# Patient Record
Sex: Male | Born: 1975
Health system: Southern US, Community
[De-identification: ages and names within clinical notes are randomized; demographics above are authoritative.]

## PROBLEM LIST (undated history)

## (undated) DIAGNOSIS — I10 Essential (primary) hypertension: Secondary | ICD-10-CM

## (undated) DIAGNOSIS — D485 Neoplasm of uncertain behavior of skin: Secondary | ICD-10-CM

## (undated) HISTORY — DX: Neoplasm of uncertain behavior of skin: D48.5

## (undated) HISTORY — PX: NO PAST SURGERIES: SHX2092

## (undated) HISTORY — DX: Essential (primary) hypertension: I10

---

## 2010-01-15 ENCOUNTER — Ambulatory Visit: Payer: Self-pay | Admitting: Family Medicine

## 2010-01-15 DIAGNOSIS — I10 Essential (primary) hypertension: Secondary | ICD-10-CM | POA: Insufficient documentation

## 2010-01-17 LAB — CONVERTED CEMR LAB
Albumin: 4.6 g/dL (ref 3.5–5.2)
Alkaline Phosphatase: 53 units/L (ref 39–117)
Basophils Relative: 3.1 % — ABNORMAL HIGH (ref 0.0–3.0)
CO2: 30 meq/L (ref 19–32)
Chloride: 108 meq/L (ref 96–112)
Eosinophils Absolute: 0.1 10*3/uL (ref 0.0–0.7)
Glucose, Bld: 79 mg/dL (ref 70–99)
HCT: 45 % (ref 39.0–52.0)
Hemoglobin: 15.1 g/dL (ref 13.0–17.0)
Lymphs Abs: 3.2 10*3/uL (ref 0.7–4.0)
MCHC: 33.6 g/dL (ref 30.0–36.0)
MCV: 88.8 fL (ref 78.0–100.0)
Monocytes Absolute: 0.4 10*3/uL (ref 0.1–1.0)
Neutro Abs: 5.5 10*3/uL (ref 1.4–7.7)
RBC: 5.07 M/uL (ref 4.22–5.81)
RDW: 12.2 % (ref 11.5–14.6)
Sodium: 144 meq/L (ref 135–145)
TSH: 2.29 microintl units/mL (ref 0.35–5.50)
Total CHOL/HDL Ratio: 4
Total Protein: 7.4 g/dL (ref 6.0–8.3)

## 2010-01-31 ENCOUNTER — Ambulatory Visit: Payer: Self-pay | Admitting: Family Medicine

## 2010-01-31 DIAGNOSIS — D485 Neoplasm of uncertain behavior of skin: Secondary | ICD-10-CM | POA: Insufficient documentation

## 2010-03-20 ENCOUNTER — Encounter: Payer: Self-pay | Admitting: Family Medicine

## 2010-04-02 DIAGNOSIS — D239 Other benign neoplasm of skin, unspecified: Secondary | ICD-10-CM

## 2010-04-02 HISTORY — DX: Other benign neoplasm of skin, unspecified: D23.9

## 2010-06-16 ENCOUNTER — Telehealth: Payer: Self-pay | Admitting: Family Medicine

## 2010-08-04 ENCOUNTER — Ambulatory Visit: Payer: Self-pay | Admitting: Family Medicine

## 2010-08-04 DIAGNOSIS — B359 Dermatophytosis, unspecified: Secondary | ICD-10-CM | POA: Insufficient documentation

## 2010-09-22 ENCOUNTER — Ambulatory Visit: Payer: Self-pay | Admitting: Family Medicine

## 2010-09-22 DIAGNOSIS — T148XXA Other injury of unspecified body region, initial encounter: Secondary | ICD-10-CM | POA: Insufficient documentation

## 2010-11-17 ENCOUNTER — Ambulatory Visit
Admission: RE | Admit: 2010-11-17 | Discharge: 2010-11-17 | Payer: Self-pay | Source: Home / Self Care | Attending: Family Medicine | Admitting: Family Medicine

## 2010-11-17 ENCOUNTER — Encounter: Payer: Self-pay | Admitting: Family Medicine

## 2010-11-18 ENCOUNTER — Telehealth: Payer: Self-pay | Admitting: Family Medicine

## 2010-12-09 NOTE — Consult Note (Signed)
Summary: [facility]  [facility]   Imported By: Lanelle Bal 04/01/2010 09:47:35  _____________________________________________________________________  External Attachment:    Type:   Image     Comment:   External Document

## 2010-12-09 NOTE — Progress Notes (Signed)
Summary: lotrel is causing a cough  Phone Note Call from Patient Call back at Work Phone [phone]   Caller: Patient Call For: Judith Part MD Summary of Call: Pt states his lotrel is making him cough.  This problem just recently started.  He is asking if he should change to something else.  Uses [facility] [address] Initial call taken by: Lowella Petties CMA,  June 16, 2010 1:01 PM  Follow-up for Phone Call        since lotrel is a drug with 2 meds -- we need to change the ace part of it and break up into 2 meds  will change to amlodipine and cozaar let me know if any problems or if cough does not stop  f/u 1 mo nurse visit for bp check px written on EMR for call in  Follow-up by: Judith Part MD,  June 16, 2010 1:40 PM  Additional Follow-up for Phone Call Additional follow up Details #1::        Patient notified as instructed by telephone. Pt said he has an appt to see Dr Milinda Antis on 08-04-10 and will see her then rather than making a nurse visit in 4 weeks. Medication phoned to [facility] pharmacy as instructed. Lewanda Rife LPN  June 16, 2010 2:35 PM    New Allergies: ! * LOTREL New/Updated Medications: AMLODIPINE BESYLATE 5 MG TABS (AMLODIPINE BESYLATE) 1 by mouth once daily COZAAR 50 MG TABS (LOSARTAN POTASSIUM) 1 by mouth once daily New Allergies: ! * LOTRELPrescriptions: COZAAR 50 MG TABS (LOSARTAN POTASSIUM) 1 by mouth once daily  #30 x 11   Entered and Authorized by:   Judith Part MD   Signed by:   Lewanda Rife LPN on 16/08/9603   Method used:   Telephoned to ...         RxID:   [id number] AMLODIPINE BESYLATE 5 MG TABS (AMLODIPINE BESYLATE) 1 by mouth once daily  #30 x 11   Entered and Authorized by:   Judith Part MD   Signed by:   Lewanda Rife LPN on 21/30/8657   Method used:   Telephoned to ...         RxID:   [id number]

## 2010-12-09 NOTE — Letter (Signed)
Summary: Out of Work  [facility] [employer] [facility] at [facility]  [address] [place], [place] [address]   Phone: [phone]  Fax: [phone]    September 22, 2010   Employee:  Raymond West    To Whom It May Concern:   For Medical reasons, please excuse the above named employee from work until back pain improves.   If you need additional information, please feel free to contact our office.         Sincerely,    Crawford Givens MD

## 2010-12-09 NOTE — Assessment & Plan Note (Signed)
Summary: 6 month follow up/rbh   Vital Signs:  Patient profile:   35 year old male Height:      70.25 inches Weight:      264 pounds BMI:     37.75 Temp:     97.7 degrees F oral Pulse rate:   84 / minute Pulse rhythm:   regular BP sitting:   128 / 82  (left arm) Cuff size:   large  Vitals Entered By: Lewanda Rife LPN (August 04, 2010 3:20 PM) CC: six month f/u   History of Present Illness: here for 6 mo f/u of HTN  was on lotrel but after developing cough was changed to separate amlodipine and cozaar  bp is good 128/82 today  feeling good  no more cough  no side effects at all   is staying away from salt  hard work -- active job   long time since Td - is always getting cut at work does not want a flu shot   has rash on R ankle -- tends to get ringworm   Allergies: 1)  ! Amoxicillin 2)  ! * Lotrel  Past History:  Past Medical History: Last updated: 01/15/2010 HTN  Past Surgical History: Last updated: 01/15/2010 Denies surgical history  Family History: Last updated: 01/15/2010 Father: Living ? kidney problems has been on dialysis, has diabetes ? 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 Eyes:  Denies blurring and eye irritation. CV:  Denies chest pain or discomfort, palpitations, shortness of breath with exertion, and swelling of feet. Resp:  Denies cough and shortness of breath. GI:  Denies nausea. GU:  Denies urinary frequency. MS:  Denies muscle aches. Derm:  Complains of itching and rash. Neuro:  Denies headaches, numbness, and sensation of room spinning. Heme:  Denies  abnormal bruising and bleeding.  Physical Exam  General:  overweight but generally well appearing  Head:  normocephalic, atraumatic, and no abnormalities observed.   Eyes:  vision grossly intact, pupils equal, pupils round, and pupils reactive to light.   Mouth:  pharynx pink and moist.   Neck:  supple with full rom and no masses or thyromegally, no JVD or carotid bruit  Lungs:  Normal respiratory effort, chest expands symmetrically. Lungs are clear to auscultation, no crackles or wheezes. Heart:  Normal rate and regular rhythm. S1 and S2 normal without gallop, murmur, click, rub or other extra sounds. Msk:  no acute joint changes  Extremities:  No clubbing, cyanosis, edema, or deformity noted with normal full range of motion of all joints.   Neurologic:  sensation intact to light touch and gait normal.   Skin:  L ankle 2-3 cm oval area of raised erythema with central clearing  some scale   Cervical Nodes:  No lymphadenopathy noted Psych:  normal affect, talkative and pleasant    Impression & Recommendations:  Problem # 1:  ESSENTIAL HYPERTENSION (ICD-401.9) Assessment Improved  improved and stable on current meds (off ace due to cough) disc lifestyle changes  stay active  f/u july for check up His updated medication list for this problem includes:    Amlodipine Besylate 5 Mg Tabs (Amlodipine besylate) .Marland Kitchen... 1 by mouth once daily  Cozaar 50 Mg Tabs (Losartan potassium) .Marland Kitchen... 1 by mouth once daily  BP today: 128/82 Prior BP: 130/88 (01/31/2010)  Labs Reviewed: K+: 4.3 (01/15/2010) Creat: : 0.9 (01/15/2010)   Chol: 163 (01/15/2010)   HDL: 39.40 (01/15/2010)   LDL: 92 (01/15/2010)   TG: 160.0 (01/15/2010)  Problem # 2:  RINGWORM (ICD-110.9) Assessment: New incidental finding of 2-3 cm rash L ankle with central clearing (pt states he gets ringworm all the time )  adv to use antifungal spray otc that usually works for him and change to new boots (wearing someone elses  boots) update if not resolved in 2 wk  Complete Medication List: 1)  Amlodipine Besylate 5 Mg Tabs (Amlodipine besylate) .Marland Kitchen.. 1 by mouth once daily 2)  Cozaar 50 Mg Tabs (Losartan potassium) .Marland Kitchen.. 1 by mouth once daily 3)  Multivitamins Tabs (Multiple vitamin) .... Take 1 tablet by mouth once a day  Other Orders: TD Toxoids IM 7 YR + ([address]) Admin 1st Vaccine (60[address])  Patient Instructions: 1)  tetnus shot today 2)  blood pressure in good control 3)  get good exercise 4)  switch your boots  5)  use anti fungal spray on rash- update me if that does not work  6)  follow up with me in july for a check up   Current Allergies (reviewed today): ! AMOXICILLIN ! * LOTREL   Immunizations Administered:  Tetanus Vaccine:    Vaccine Type: Td    Site: left deltoid    Mfr: Sanofi Pasteur    Dose: 0.5 ml    Route: IM    Given by: Lewanda Rife LPN    Exp. Date: 12/11/2011    Lot #: [id number]    VIS given: 09/26/08 version given August 04, 2010.

## 2010-12-09 NOTE — Assessment & Plan Note (Signed)
Summary: LOWER BACK PAIN/DLO   Vital Signs:  Patient profile:   35 year old male Height:      70.25 inches Weight:      264 pounds BMI:     37.75 Temp:     97.6 degrees F oral Pulse rate:   80 / minute Pulse rhythm:   regular BP sitting:   110 / 62  (left arm) Cuff size:   large  Vitals Entered By: Delilah Shan CMA Adilynne Fitzwater Dull) (September 22, 2010 11:09 AM) CC: LBP   History of Present Illness: Sat PM and Sunday all day.  Pain in lower back.  Couldn't go to work today.  Some help with laying on the floor, but tough to get up.  Lower back, just to R of midline.  No new triggers.  Played softball on Saturday.  No pain down leg.  More pain standing on R leg.  No FCNAV.  R handed. No h/o pop/snapping per patient.  Worse pain this AM, some better as the day goes on.  Better with movement, stiffer wtih prolonged sitting/laying down.   Allergies: 1)  ! Amoxicillin 2)  ! * Lotrel  Review of Systems       See HPI.  Otherwise negative.    Physical Exam  General:  NAD RRR CTAB no midline pain in back.  R of midline in lower L spine with pain reported, not worse with palpation.  SLR neg but hamstring tight and this stretches tender are in back.  no pain in int/ext rotation of hips.  distally nv intact.     Impression & Recommendations:  Problem # 1:  MUSCLE STRAIN (ICD-848.9) Likley benign muscle strain.  D/w patient ZO:XWRU and stretching.  Ibuprofen in meantime and out of for now.  Should improve gradually.  follow up as needed, no need to image.  He agrees/understands.   Complete Medication List: 1)  Amlodipine Besylate 5 Mg Tabs (Amlodipine besylate) .Marland Kitchen.. 1 by mouth once daily 2)  Cozaar 50 Mg Tabs (Losartan potassium) .Marland Kitchen.. 1 by mouth once daily 3)  Multivitamins Tabs (Multiple vitamin) .... Take 1 tablet by mouth once a day  Patient Instructions: 1)  I would use the knee to chest stretch (straight ahead and across your trunk) and try to stretch easily.  Use a heating pad and let  me know if you aren't getting better.   Take care.    Orders Added: 1)  Est. Patient Level III [04540]    Current Allergies (reviewed today): ! AMOXICILLIN ! * LOTREL

## 2010-12-09 NOTE — Assessment & Plan Note (Signed)
Summary: 2-4 WEEK FOLLOW UP/RBH   Vital Signs:  Patient profile:   35 year old male Height:      70.25 inches Weight:      258.25 pounds BMI:     36.92 Temp:     97.3 degrees F oral Pulse rate:   76 / minute Pulse rhythm:   regular BP sitting:   130 / 88  (left arm) Cuff size:   large  Vitals Entered By: Linde Gillis CMA Duncan Dull) (January 31, 2010 11:58 AM)  Serial Vital Signs/Assessments:  Time      Position  BP       Pulse  Resp  Temp     By                     045/40                         Judith Part MD  CC: 2-4 week follow up   History of Present Illness: last visit started on lotrel 5-10 for HTN  no side eff on med  feels fine  no swelling in legs  cannot tell he is on it   bp is improved today- first check 130/88  labs were ok  Last Lipid ProfileCholesterol: 163 (01/15/2010 4:24:37 PM)HDL:  39.40 (01/15/2010 4:24:37 PM)LDL:  92 (01/15/2010 4:24:37 PM)Triglycerides:  Last Liver profileSGOT:  36 (01/15/2010 4:24:37 PM)SPGT:  42 (01/15/2010 4:24:37 PM)T. Bili:  0.6 (01/15/2010 4:24:37 PM)Alk Phos:  53 (01/15/2010 4:24:37 PM)   stopped sodas completely lots of water avoiding salt and processed pork more grilled meats working physically a lot   has a mole on his back- is speckled       Allergies: 1)  ! Amoxicillin  Past History:  Past Medical History: Last updated: 01/15/2010 HTN  Past Surgical History: Last updated: 01/15/2010 Denies surgical history  Family History: Last updated: 01/15/2010 Father: Living ? kidney problems has been on dialysis, has diabetes ? 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 Eyes:  Denies blurring. CV:  Denies chest pain or discomfort, palpitations, shortness of breath with exertion, and swelling of feet. Resp:  Denies cough and shortness of breath. GI:  Denies abdominal pain, change in bowel habits, and nausea. MS:  Denies muscle aches. Derm:  Denies poor wound healing and rash. Neuro:  Denies headaches, numbness, and tingling. Endo:  Denies excessive thirst and excessive urination. Heme:  Denies abnormal bruising and bleeding.  Physical Exam  General:  overweight but generally well appearing  Head:  normocephalic, atraumatic, and no abnormalities observed.   Eyes:  vision grossly intact, pupils equal, pupils round, and pupils reactive to light.   Mouth:  pharynx pink and moist.   Neck:  supple with full rom and no masses or thyromegally, no JVD or carotid bruit  Lungs:  Normal respiratory effort, chest expands symmetrically. Lungs are clear to auscultation, no crackles or wheezes. Heart:  Normal rate and regular rhythm. S1 and S2 normal without gallop, murmur, click, rub or other extra sounds. Msk:  No deformity or scoliosis noted of thoracic or lumbar spine.   Extremities:  No clubbing, cyanosis,  edema, or deformity noted with normal full range of motion of all joints.   Skin:  3-4 mm irregular flat nevus mid back - light brown with specks of black color  flushed/ ruddy complexion Cervical Nodes:  No lymphadenopathy noted Psych:  normal affect, talkative and pleasant    Impression & Recommendations:  Problem # 1:  ESSENTIAL HYPERTENSION (ICD-401.9) Assessment Improved much imp with lotrel  will continue this and lifestyle changes f/u 6 mo  if up - will consider inc dose rev labs in detail today with fair chol prof (rev sat fats in diet)  His updated medication list for this problem includes:    Lotrel 5-10 Mg Caps (Amlodipine besy-benazepril hcl) .Marland Kitchen... 1 by mouth once daily in am  Problem # 2:  NEOPLASM, SKIN, UNCERTAIN  BEHAVIOR (ICD-238.2) Assessment: New nevus on back - irregular shape and color ref derm  pt wears clothing on back- no sunscreen - counseled on this  Orders: Dermatology Referral (Derma)  Complete Medication List: 1)  Lotrel 5-10 Mg Caps (Amlodipine besy-benazepril hcl) .Marland Kitchen.. 1 by mouth once daily in am  Patient Instructions: 1)  keep up the good work with better diet  2)  keep up exercise 3)  no change in medicine  4)  blood pressure better today 122/88  5)  follow up with me in about 6 months  6)  we will do derm ref at check out  Prescriptions: LOTREL 5-10 MG CAPS (AMLODIPINE BESY-BENAZEPRIL HCL) 1 by mouth once daily in am  #30 x 11   Entered and Authorized by:   Judith Part MD   Signed by:   Judith Part MD on 01/31/2010   Method used:   Electronically to        [facility]. [address] [phone]* (retail)       [address] [place], [place]  [id number]       Ph: [phone]       Fax: [phone]   RxID:   [id number]   Current Allergies (reviewed today): ! AMOXICILLIN

## 2010-12-09 NOTE — Assessment & Plan Note (Signed)
Summary: NEW PATIENT EST per Dr. Milinda Antis / LFW   Vital Signs:  Patient profile:   35 year old male Height:      70.25 inches Weight:      258.25 pounds BMI:     36.92 Temp:     97.7 degrees F oral Pulse rate:   76 / minute Pulse rhythm:   regular BP sitting:   160 / 98  (left arm) Cuff size:   large  Vitals Entered By: Lewanda Rife LPN (January 16, 1323 3:42 PM)   History of Present Illness: here to est as new pt and address high bp   thinks his bp has been up for a while   had an assessment at work -- with a physical course for that  was high 140/104 on first check -- nurse got alamed and would not let him do the test  diet is pretty good lots of grilled food and rice some sodas/ caffiene -- really likes soda --- then quit it (now just water)  that was 1 week ago  not a lot of processed foods or salt  some beef and some fried foods -- not a lot   in terms of exercise - very strenous job working in hot conditions -- with lots of heavy lifting    has lost 6 lb since then   is feeling good  no symptoms from HTN for the most part   went to Dr Mackey Birchwood -- and was put on benicar 20 mg free sample  never stated it  used to go to [employer]     Preventive Screening-Counseling & Management  Alcohol-Tobacco     Smoking Status: never  Allergies (verified): 1)  ! Amoxicillin  Past History:  Family History: Last updated: 01/15/2010 Father: Living ? kidney problems has been on dialysis, has diabetes ? 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 kidney problems  has been on dialysis, has diabetes ? 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 Eyes:  Denies blurring and eye irritation. CV:  Denies chest pain or discomfort, palpitations, and shortness of breath with exertion. Resp:  Denies cough and wheezing. GI:  Denies abdominal pain, bloody stools, change in bowel habits, indigestion, and nausea. GU:  Denies urinary frequency. MS:  Denies joint pain, joint redness, and joint swelling. Derm:  Denies itching, lesion(s), poor wound healing, and rash. Neuro:  Denies difficulty with concentration, disturbances in coordination, headaches, numbness, and tingling. Psych:  Denies anxiety and depression. Endo:  Denies cold intolerance, excessive thirst, excessive urination, and heat intolerance. Heme:  Denies abnormal bruising and bleeding.  Physical Exam  General:  overweight but generally well appearing  Head:  normocephalic, atraumatic, and no abnormalities observed.   Eyes:  vision grossly intact, pupils equal, pupils round, and pupils reactive to light.  grossly nl fundi no conjunctival pallor, injection or icterus  Ears:  R ear normal and L ear normal.   Nose:  no nasal discharge.   Mouth:  pharynx pink and moist.   Neck:  supple with full rom and no masses or thyromegally, no JVD or carotid bruit  Chest Wall:  No deformities, masses, tenderness or gynecomastia noted. Lungs:  Normal respiratory effort, chest expands symmetrically. Lungs are clear to auscultation, no crackles or wheezes. Heart:  Normal rate and regular rhythm. S1 and S2 normal without gallop, murmur, click, rub or other extra sounds. Abdomen:  Bowel sounds  positive,abdomen soft and non-tender without masses, organomegaly or hernias noted. no renal bruits  Msk:  No deformity or scoliosis noted of thoracic or lumbar spine.  no acute joint changes large / muscular build  Pulses:  R and L carotid,radial,femoral,dorsalis pedis and posterior tibial pulses are full and equal bilaterally Extremities:  No clubbing, cyanosis, edema, or deformity noted with normal full range of motion of all joints.   Neurologic:  cranial nerves II-XII intact, sensation intact to light touch, gait normal, and DTRs symmetrical and normal.  no tremor  Skin:  flushed cheeks and face- per pt baseline Cervical Nodes:  No lymphadenopathy noted Inguinal Nodes:  No significant adenopathy Psych:  normal affect, talkative and pleasant    Impression & Recommendations:  Problem # 1:  ESSENTIAL HYPERTENSION (ICD-401.9) Assessment New new and  moderate  possible fam hx  disc lifestyle change in detail - and handouts given from [facility]  start lotrel 5-10- update if any side eff  lab today  f/u in 2-4 weeks  His updated medication list for this problem includes:    Lotrel 5-10 Mg Caps (Amlodipine besy-benazepril hcl) .Marland Kitchen... 1 by mouth once daily in am  Orders: Venipuncture ([address]) TLB-Lipid Panel (80061-LIPID) TLB-BMP (Basic Metabolic Panel-BMET) (80048-METABOL) TLB-CBC Platelet - w/Differential (85025-CBCD) TLB-Hepatic/Liver Function Pnl (80076-HEPATIC) TLB-TSH (Thyroid Stimulating Hormone) (09811-BJY) Prescription Created Electronically [phone])  Complete Medication List: 1)  Lotrel 5-10 Mg Caps (Amlodipine besy-benazepril hcl) .Marland Kitchen.. 1 by mouth once daily in am  Patient Instructions: 1)  start lotrel - update me if any problems or side effects  2)  drink lots of water 3)  avoid salty foods  4)  stay away from soda 5)  stay active  6)  labs today 7)  follow up with me in 2-4 weeks  Prescriptions: LOTREL 5-10 MG CAPS (AMLODIPINE BESY-BENAZEPRIL HCL) 1 by mouth once daily  in am  #30 x 5   Entered and Authorized by:   Judith Part MD   Signed by:   Judith Part MD on 01/15/2010   Method used:   Electronically to        [facility]. [address] [phone]* (retail)       [address] [place], [place]  [address]       Ph: [phone]       Fax: [phone]   RxID:   [id number]   Prior Medications (reviewed today): None Current Allergies (reviewed today): ! AMOXICILLIN

## 2010-12-11 NOTE — Assessment & Plan Note (Signed)
Summary: ST,COUGH/CLE   Vital Signs:  Patient profile:   35 year old male Height:      70.25 inches Weight:      265.75 pounds BMI:     38.00 Temp:     97.6 degrees F oral Pulse rate:   84 / minute Pulse rhythm:   regular BP sitting:   130 / 80  (left arm) Cuff size:   large  Vitals Entered By: Delilah Shan CMA (AAMA) (November 17, 2010 2:30 PM) CC: ST, cough   History of Present Illness: Cough and ST for 1 week, facial congestion.  Wife was getting sick, currently on antibiotics.  No NAV but subjective fevers/chills last week.  Still with ST.  Occ sputum, yellow.  No tob.    Allergies: 1)  ! Amoxicillin 2)  ! * Lotrel  Review of Systems       See HPI.  Otherwise negative.    Physical Exam  General:  GEN: nad, alert and oriented HEENT: mucous membranes moist, TM w/o erythema, nasal epithelium injected, OP with cobblestoning and erythema NECK: supple w/o LA CV: rrr. PULM: ctab except for scattered ronchi, no inc wob ABD: soft, +bs EXT: no edema    Impression & Recommendations:  Problem # 1:  COUGH (ICD-786.2) supportive tx and start antibiotics for presumed bronchitis.  nontoxic and follow up as needed.  He agrees.  potentially contagious.  Return to work when cough improved.  Orders: Prescription Created Electronically [phone])  Complete Medication List: 1)  Amlodipine Besylate 5 Mg Tabs (Amlodipine besylate) .Marland Kitchen.. 1 by mouth once daily 2)  Cozaar 50 Mg Tabs (Losartan potassium) .Marland Kitchen.. 1 by mouth once daily 3)  Multivitamins Tabs (Multiple vitamin) .... Take 1 tablet by mouth once a day 4)  Zithromax 250 Mg Tabs (Azithromycin) .... 2 by mouth today and then 1 by mouth once daily for 4 days.  Patient Instructions: 1)  I would use the delsym for cough as needed and start the antibiotics today.  Let us know if you don't gradually improve.   Prescriptions: ZITHROMAX 250 MG TABS (AZITHROMYCIN) 2 by mouth today and then 1 by mouth once daily for 4 days.  #6 x 0  Entered and Authorized by:   Crawford Givens MD   Signed by:   Crawford Givens MD on 11/17/2010   Method used:   Electronically to        [facility]. [address] [phone]* (retail)       [address] [place], [place]  [id number]       Ph: [phone]       Fax: [phone]   RxID:   [id number]    Orders Added: 1)  Prescription Created Electronically [[id number]] 2)  Est. Patient Level III [34742]    Current Allergies (reviewed today): ! AMOXICILLIN ! * LOTREL

## 2010-12-11 NOTE — Progress Notes (Signed)
Summary: cough  Phone Note Call from Patient Call back at Home Phone [phone]   Caller: Patient Call For: Dr. Para March  Summary of Call: Patient was seen yesterday. He says that his cough has gotten worse, kept him up all night. He is asking if can get something for the cough called in to [facility] [address]  Initial call taken by: Melody Comas,  November 18, 2010 1:17 PM  Follow-up for Phone Call        please call in.  sedation caution.  follow up as needed.  Follow-up by: Crawford Givens MD,  November 18, 2010 1:32 PM  Additional Follow-up for Phone Call Additional follow up Details #1::        Patient Advised. Medication phoned to pharmacy.  Additional Follow-up by: Delilah Shan CMA (AAMA),  November 18, 2010 2:19 PM    New/Updated Medications: HYDROCODONE-HOMATROPINE 5-1.5 MG/5ML SYRP (HYDROCODONE-HOMATROPINE) 5ml by mouth q6h as needed for cough, sedation caution Prescriptions: HYDROCODONE-HOMATROPINE 5-1.5 MG/5ML SYRP (HYDROCODONE-HOMATROPINE) 5ml by mouth q6h as needed for cough, sedation caution  #6oz x 0   Entered and Authorized by:   Crawford Givens MD   Signed by:   Crawford Givens MD on 11/18/2010   Method used:   Telephoned to ...       [facility] [address] [phone]* (retail)       [address] [place], [place]  [id number]       Ph: [phone]       Fax: [phone]   RxID:   [id number]

## 2010-12-11 NOTE — Letter (Signed)
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         Sincerely,    Crawford Givens MD

## 2011-04-14 ENCOUNTER — Ambulatory Visit (INDEPENDENT_AMBULATORY_CARE_PROVIDER_SITE_OTHER): Payer: BC Managed Care – PPO | Admitting: Family Medicine

## 2011-04-14 ENCOUNTER — Encounter: Payer: Self-pay | Admitting: Family Medicine

## 2011-04-14 ENCOUNTER — Telehealth: Payer: Self-pay | Admitting: *Deleted

## 2011-04-14 VITALS — BP 118/70 | HR 92 | Temp 97.6°F | Ht 73.0 in | Wt 257.0 lb

## 2011-04-14 DIAGNOSIS — R079 Chest pain, unspecified: Secondary | ICD-10-CM

## 2011-04-14 DIAGNOSIS — S20219A Contusion of unspecified front wall of thorax, initial encounter: Secondary | ICD-10-CM

## 2011-04-14 DIAGNOSIS — R0781 Pleurodynia: Secondary | ICD-10-CM

## 2011-04-14 MED ORDER — IBUPROFEN 600 MG PO TABS: 600.0000 mg | ORAL_TABLET | Freq: Three times a day (TID) | ORAL | Status: AC | PRN

## 2011-04-14 MED ORDER — IBUPROFEN 600 MG PO TABS: 600.0000 mg | ORAL_TABLET | Freq: Three times a day (TID) | ORAL | Status: DC | PRN

## 2011-04-14 MED ORDER — TRAMADOL HCL 50 MG PO TABS: 50.0000 mg | ORAL_TABLET | Freq: Three times a day (TID) | ORAL | Status: AC | PRN

## 2011-04-14 NOTE — Telephone Encounter (Signed)
Rodney Booze just let me know he got in with Dr Reece Agar this afternoon-- thanks

## 2011-04-14 NOTE — Patient Instructions (Signed)
Rib bruise or fracture - use ibuprofen 600mg  2-3 times daily with food. May use tramadol for breakthrough pain. Ice to chest wall for inflammation. Update [place] if any questions or not improving as expected.  Bruised Ribs Bruised ribs can occur by a blow to the chest or by a fall against a hard object. Usually these will be much better in a couple weeks. If x-rays were taken today and no fractures (break in bone), the diagnosis (learning what is wrong) of bruising is made. However, broken ribs may not show up for several days, or may be discovered later on a routine x-ray when signs of healing show up. If this happens to you, it does not mean that something was missed on the x-ray, but simply that it did not show up on the first x-rays. Earlier diagnosis will not usually change the treatment.  HOME CARE INSTRUCTIONS  Avoid strenuous activity. Be careful during activities and avoid bumping the injured ribs. Activities that pull on the injured ribs and cause pain should be avoided, if possible.   For the first day or two, an ice pack used every 20 minutes while awake may be helpful. Put ice in a plastic bag and put a towel between the bag and the skin.   Eat a normal, well-balanced diet. Drink plenty of fluids to avoid constipation.   Take deep breaths several times a day to keep lungs free of infection. Try to cough several times a day. Splint the injured area with a pillow while coughing to ease pain. Coughing can help prevent pneumonia.   Wear a rib belt or binder ONLY IF told to do so by your caregiver. If you are wearing a rib belt or binder, you must do the breathing exercises as directed by your caregiver. If not used properly, rib belts and/or binders restrict breathing which can lead to pneumonia.   Only take over-the-counter or prescription medicines for pain, discomfort, or fever as directed by your caregiver.  SEEK MEDICAL CARE IF:  You or your child has an oral temperature above 101.     Your baby is older than 3 months with a rectal temperature of 100.5 F (38.1 C) or higher for more than 1 day.   You develop a cough, with thick or bloody sputum.  SEEK IMMEDIATE MEDICAL CARE IF:  You have difficulty breathing.   You feel sick to your stomach (nausea), have vomiting or belly (abdominal) pain.   You have worsening pain, not controlled with medications, or there is a change in the location of the pain.   You develop sweating or radiation of the pain into the arms, jaw or shoulders, or become light headed or faint.   You or your child has an oral temperature above 101, not controlled by medicine.   Your or your baby is older than 3 months with a rectal temperature of 102 F (38.9 C) or higher.   Your baby is 105 months old or younger with a rectal temperature of 100.4 F (38 C) or higher.  MAKE SURE YOU:  Understand these instructions.   Will watch your condition.   Will get help right away if you are not doing well or get worse.  Document Released: 07/21/2001 Document Re-Released: 11/17/2009 [facility] Patient Information 2011 [place], [place].

## 2011-04-14 NOTE — Progress Notes (Signed)
  Subjective:    Patient ID: Raymond West, male    DOB: 31-Oct-1976, 35 y.o.   MRN: [id number]  HPI CC: rib pain  DOI: 04/12/2011 Horse ran into him (back side).  Knocked him backwards.  Pain on side of left lower ribcage.  Pain with changing positions, cough, sneezing, laughing.  Sitting still fine.  hsan't tried anything for this.  (no NSAIDs, ice).  Pain not changing.  No SOB.  No bruising.  No cough.  No smokers at home.  No h/o lung injuries in past.  Works at [employer].  Some heavy lifting.  Review of Systems Per HPI    Objective:   Physical Exam  Nursing note and vitals reviewed. Constitutional: He appears well-developed and well-nourished. No distress.  HENT:  Head: Normocephalic and atraumatic.  Cardiovascular: Normal rate, regular rhythm, normal heart sounds and intact distal pulses.   No murmur heard. Pulmonary/Chest: Effort normal and breath sounds normal. No accessory muscle usage. No respiratory distress. He has no decreased breath sounds. He has no wheezes. He has no rhonchi. He has no rales. He exhibits tenderness.         Breath sounds intact throughout. No bony crepitus.  Skin: Skin is warm and dry. No bruising, no ecchymosis and no rash noted.          Assessment & Plan:

## 2011-04-14 NOTE — Assessment & Plan Note (Signed)
Contusion vs fracture. Did not xray as would not change management. Recommended rest, ice, NSAIDs and tramadol for breakthrough. Lungs clear toda. Update if any worsening or red flags (pain worsening, trouble breathing).

## 2011-04-14 NOTE — Telephone Encounter (Signed)
Pt was hit by a horse Sunday c/o rib pain and sob. Wife says he cannot/ will not go to ER or urgent care b/c their deductible is $5,000 which has to be paid up front. She wants to have him worked in today if possible. Pt is at work now and not able to get to office until after 12 if he can be worked in. No one has any appt's. Please advise?

## 2011-05-22 ENCOUNTER — Encounter: Payer: Self-pay | Admitting: Family Medicine

## 2011-05-22 ENCOUNTER — Ambulatory Visit (INDEPENDENT_AMBULATORY_CARE_PROVIDER_SITE_OTHER): Payer: BC Managed Care – PPO | Admitting: Family Medicine

## 2011-05-22 VITALS — BP 118/76 | HR 80 | Temp 97.8°F | Ht 73.0 in | Wt 251.8 lb

## 2011-05-22 DIAGNOSIS — I10 Essential (primary) hypertension: Secondary | ICD-10-CM

## 2011-05-22 LAB — CBC WITH DIFFERENTIAL/PLATELET
Eosinophils Absolute: 0.2 10*3/uL (ref 0.0–0.7)
Lymphs Abs: 3.4 10*3/uL (ref 0.7–4.0)
MCH: 28.8 pg (ref 26.0–34.0)
Neutro Abs: 4.7 10*3/uL (ref 1.7–7.7)
Neutrophils Relative %: 53 % (ref 43–77)
Platelets: 220 10*3/uL (ref 150–400)
RBC: 5.04 MIL/uL (ref 4.22–5.81)
WBC: 8.8 10*3/uL (ref 4.0–10.5)

## 2011-05-22 LAB — COMPREHENSIVE METABOLIC PANEL
ALT: 30 U/L (ref 0–53)
Alkaline Phosphatase: 55 U/L (ref 39–117)
CO2: 27 mEq/L (ref 19–32)
Sodium: 141 mEq/L (ref 135–145)
Total Bilirubin: 0.8 mg/dL (ref 0.3–1.2)
Total Protein: 7 g/dL (ref 6.0–8.3)

## 2011-05-22 LAB — LIPID PANEL
Total CHOL/HDL Ratio: 4.5 Ratio
VLDL: 19 mg/dL (ref 0–40)

## 2011-05-22 MED ORDER — LOSARTAN POTASSIUM 50 MG PO TABS: 50.0000 mg | ORAL_TABLET | Freq: Every day | ORAL | Status: DC

## 2011-05-22 MED ORDER — AMLODIPINE BESYLATE 5 MG PO TABS: 5.0000 mg | ORAL_TABLET | Freq: Every day | ORAL | Status: DC

## 2011-05-22 NOTE — Patient Instructions (Signed)
Labs today I sent px to the pharmacy  Keep up good exercise  Keep hydrated this summer Alert [place] if any problems

## 2011-05-22 NOTE — Assessment & Plan Note (Signed)
Improved and remains in good control with current meds - amlodipine and losartan No problems Rev healthy low sodium diet  Also imp of hydration working in hot weather  Refilled med Lab today

## 2011-05-22 NOTE — Progress Notes (Signed)
Subjective:    Patient ID: Raymond West, male    DOB: 1976/05/02, 35 y.o.   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 NEOPLASM, SKIN, UNCERTAIN BEHAVIOR  . ESSENTIAL HYPERTENSION  . MUSCLE STRAIN  . Rib contusion   Past Medical History  Diagnosis Date  . Unspecified essential hypertension   . Neoplasm of uncertain behavior of skin    No past surgical history on file. History  Substance Use Topics  . Smoking status: Never Smoker   . Smokeless tobacco: Not on file  . Alcohol Use: Yes     rarely   Family History  Problem Relation Age of Onset  . Kidney disease Father     has been on dialysis for years  . Diabetes Father     ? -unsure  . Leukemia Mother   . Diabetes Mother   . Arthritis Maternal Grandfather   . Arthritis Maternal Grandmother   . Arthritis Paternal Grandfather   . Arthritis Paternal Grandmother    Allergies  Allergen Reactions  . Amoxicillin     REACTION: rash  . Benazepril Cough   Current Outpatient Prescriptions on File Prior to Visit  Medication Sig Dispense Refill  . Multiple Vitamin (MULTIVITAMIN) tablet Take 1 tablet by mouth daily.                 Review of Systems Review of Systems  Constitutional: Negative for fever, appetite change, fatigue and unexpected weight change.  Eyes: Negative for pain and visual disturbance.  Respiratory: Negative for cough and shortness of breath.   Cardiovascular: Negative.  for cp or cp or sob or palpitations Gastrointestinal: Negative for nausea, diarrhea and constipation.   Genitourinary: Negative for urgency and frequency.  Skin: Negative for pallor.  Neurological: Negative for weakness, light-headedness, numbness and headaches.  Hematological: Negative for adenopathy. Does not bruise/bleed easily.  Psychiatric/Behavioral: Negative for dysphoric mood. The patient is not nervous/anxious.          Objective:   Physical Exam  Constitutional: He appears well-developed and well-nourished. No distress.  HENT:  Head: Normocephalic and atraumatic.  Mouth/Throat: Oropharynx is clear and moist.  Eyes: Conjunctivae and EOM are normal. Pupils are equal, round, and reactive to light.  Neck: Normal range of motion. Neck supple. No JVD present. Carotid bruit is not present. No thyromegaly present.  Cardiovascular: Normal rate, regular rhythm, normal heart sounds and intact distal pulses.   No murmur heard. Pulmonary/Chest: Effort normal and breath sounds normal. No respiratory distress. He has no wheezes. He has no rales.  Abdominal: Soft. Bowel sounds are normal. He exhibits no distension, no abdominal bruit and no mass. There is no tenderness.  Musculoskeletal: Normal range of motion. He exhibits no edema and no tenderness.  Lymphadenopathy:    He has no cervical adenopathy.  Neurological: He is alert. He has normal reflexes. Coordination normal.  Skin: Skin is warm and dry. No rash  noted. No erythema. No pallor.  Psychiatric: He has a normal mood and affect.          Assessment & Plan:

## 2012-05-27 ENCOUNTER — Encounter: Payer: Self-pay | Admitting: Family Medicine

## 2012-05-27 ENCOUNTER — Ambulatory Visit (INDEPENDENT_AMBULATORY_CARE_PROVIDER_SITE_OTHER): Payer: PRIVATE HEALTH INSURANCE | Admitting: Family Medicine

## 2012-05-27 VITALS — BP 122/80 | HR 57 | Temp 97.8°F | Ht 72.0 in | Wt 256.2 lb

## 2012-05-27 DIAGNOSIS — I10 Essential (primary) hypertension: Secondary | ICD-10-CM

## 2012-05-27 DIAGNOSIS — Z0000 Encounter for general adult medical examination without abnormal findings: Secondary | ICD-10-CM

## 2012-05-27 MED ORDER — LOSARTAN POTASSIUM 50 MG PO TABS: 50.0000 mg | ORAL_TABLET | Freq: Every day | ORAL | Status: DC

## 2012-05-27 MED ORDER — AMLODIPINE BESYLATE 5 MG PO TABS: 5.0000 mg | ORAL_TABLET | Freq: Every day | ORAL | Status: DC

## 2012-05-27 NOTE — Assessment & Plan Note (Signed)
Reviewed health habits including diet and exercise and skin cancer prevention Also reviewed health mt list, fam hx and immunizations  Labs for wellness today Disc need for wt loss and plan to do it

## 2012-05-27 NOTE — Progress Notes (Signed)
Subjective:    Patient ID: Raymond West, male    DOB: 1976/09/02, 36 y.o.   MRN: [id number]  HPI  Here for health maintenance exam and to review chronic medical problems   Doing well   Has some time off coming up - going to the beach  Doing well  Feeling good  No new complaints   bp goodToday BP Readings from Last 3 Encounters:  05/27/12 130/84  05/22/11 118/76  04/14/11 118/70   does not check at home  No cp or palpitations or headaches or edema  No side effects to medicines  - cozaar and norvasc   Wt is up 5 lb with bmi of 34 Very muscular build however  imms- does not get flu shot  utd Td  Diet - healthy diet/ well balanced / never adds salt , but does eat some junk food  A lot of sandwhiches  Exercise -- very hard physical work every day -- also working out in the heat Does not wear sunscreen unless he feels burned    Patient Active Problem List  Diagnosis  . NEOPLASM, SKIN, UNCERTAIN BEHAVIOR  . ESSENTIAL HYPERTENSION  . MUSCLE STRAIN  . Rib contusion  . Routine general medical examination at a health care facility   Past Medical History  Diagnosis Date  . Unspecified essential hypertension   . Neoplasm of uncertain behavior of skin    No past surgical history on file. History  Substance Use Topics  . Smoking status: Never Smoker   . Smokeless tobacco: Not on file  . Alcohol Use: Yes     rarely   Family History  Problem Relation Age of Onset  . Kidney disease Father     has been on dialysis for years  . Diabetes Father     ? -unsure  . Leukemia Mother   . Diabetes Mother   . Arthritis Maternal Grandfather   . Arthritis Maternal Grandmother   . Arthritis Paternal Grandfather   . Arthritis Paternal Grandmother    Allergies  Allergen Reactions  . Amoxicillin     REACTION: rash  . Benazepril Cough   Current Outpatient Prescriptions on File Prior to Visit  Medication Sig Dispense Refill  . amLODipine (NORVASC) 5 MG tablet Take 1 tablet (5 mg  total) by mouth daily.  30 tablet  11  . losartan (COZAAR) 50 MG tablet Take 1 tablet (50 mg total) by mouth daily.  30 tablet  11  . Multiple Vitamin (MULTIVITAMIN) tablet Take 1 tablet by mouth daily.          Review of Systems Review of Systems  Constitutional: Negative for fever, appetite change, fatigue and unexpected weight change.  Eyes: Negative for pain and visual disturbance.  Respiratory: Negative for cough and shortness of breath.   Cardiovascular: Negative for cp or palpitations    Gastrointestinal: Negative for nausea, diarrhea and constipation.  Genitourinary: Negative for urgency and frequency.  Skin: Negative for pallor or rash   Neurological: Negative for weakness, light-headedness, numbness and headaches.  Hematological: Negative for adenopathy. Does not bruise/bleed easily.  Psychiatric/Behavioral: Negative for dysphoric mood. The patient is not nervous/anxious.         Objective:   Physical Exam  Constitutional: He appears well-developed and well-nourished. No distress.  HENT:  Head: Normocephalic and atraumatic.  Mouth/Throat: Oropharynx is clear and moist.  Eyes: Conjunctivae and EOM are normal. Pupils are equal, round, and reactive to light. No scleral icterus.  Neck: Normal range of  motion. Neck supple. No JVD present. Carotid bruit is not present. Erythema present. No thyromegaly present.  Cardiovascular: Normal rate, regular rhythm, normal heart sounds and intact distal pulses.  Exam reveals no gallop.   Pulmonary/Chest: Effort normal and breath sounds normal. No respiratory distress. He has no wheezes.  Abdominal: Soft. Bowel sounds are normal. He exhibits no distension, no abdominal bruit and no mass. There is no tenderness.  Musculoskeletal: Normal range of motion. He exhibits no edema and no tenderness.  Lymphadenopathy:    He has no cervical adenopathy.  Neurological: He is alert. He has normal reflexes. No cranial nerve deficit. He exhibits normal  muscle tone. Coordination normal.  Skin: Skin is warm and dry. No rash noted. No erythema. No pallor.  Psychiatric: He has a normal mood and affect.          Assessment & Plan:

## 2012-05-27 NOTE — Assessment & Plan Note (Signed)
bp in fair control at this time  No changes needed  Disc lifstyle change with low sodium diet and exercise   Labs today emph importance of wt loss - he is very active so will work on cutting portions and junk food

## 2012-05-27 NOTE — Patient Instructions (Addendum)
Work on [employer] - try to start cutting back portions by 1/3 to 1/4 for weight loss  Also eat less junk food in general Blood pressure is is good  Labs today  Wear sunscreen every day

## 2012-05-28 LAB — CBC WITH DIFFERENTIAL/PLATELET
Eosinophils Relative: 1 % (ref 0–5)
HCT: 40.5 % (ref 39.0–52.0)
Hemoglobin: 14.2 g/dL (ref 13.0–17.0)
Lymphocytes Relative: 34 % (ref 12–46)
Lymphs Abs: 3.1 10*3/uL (ref 0.7–4.0)
MCH: 29.6 pg (ref 26.0–34.0)
MCV: 84.6 fL (ref 78.0–100.0)
Monocytes Absolute: 0.7 10*3/uL (ref 0.1–1.0)
Monocytes Relative: 8 % (ref 3–12)
Platelets: 211 10*3/uL (ref 150–400)
RBC: 4.79 MIL/uL (ref 4.22–5.81)
WBC: 8.9 10*3/uL (ref 4.0–10.5)

## 2012-05-28 LAB — COMPREHENSIVE METABOLIC PANEL
ALT: 28 U/L (ref 0–53)
CO2: 28 mEq/L (ref 19–32)
Calcium: 9.2 mg/dL (ref 8.4–10.5)
Chloride: 106 mEq/L (ref 96–112)
Creat: 0.96 mg/dL (ref 0.50–1.35)
Glucose, Bld: 77 mg/dL (ref 70–99)

## 2012-05-28 LAB — LIPID PANEL
Cholesterol: 163 mg/dL (ref 0–200)
HDL: 34 mg/dL — ABNORMAL LOW (ref >39)

## 2012-05-28 LAB — TSH: TSH: 2.087 u[IU]/mL (ref 0.350–4.500)

## 2012-05-30 NOTE — Progress Notes (Signed)
Left message on patient vm informing him of his labs.

## 2012-05-30 NOTE — Progress Notes (Signed)
Informed patient as instructed 

## 2012-11-19 ENCOUNTER — Ambulatory Visit (INDEPENDENT_AMBULATORY_CARE_PROVIDER_SITE_OTHER): Payer: BC Managed Care – PPO | Admitting: Internal Medicine

## 2012-11-19 ENCOUNTER — Encounter: Payer: Self-pay | Admitting: Internal Medicine

## 2012-11-19 VITALS — BP 132/80 | HR 80 | Temp 97.5°F | Wt 259.0 lb

## 2012-11-19 DIAGNOSIS — J069 Acute upper respiratory infection, unspecified: Secondary | ICD-10-CM

## 2012-11-19 MED ORDER — AZITHROMYCIN 250 MG PO TABS: ORAL_TABLET | ORAL | Status: AC

## 2012-11-19 NOTE — Progress Notes (Signed)
  Subjective:    Patient ID: Raymond West, male    DOB: 1976/03/02, 37 y.o.   MRN: [id number]  HPI Mr. Stene presents with a 5 day h/o URI symptoms. He has felt feverish but no documented fever. C/o sinus pressure, lots purulent rhinorrhea, non-productive cough, mild SOB.No N/V/D. Has been taking otc nyquil  Past Medical History  Diagnosis Date  . Unspecified essential hypertension   . Neoplasm of uncertain behavior of skin    No past surgical history on file. Family History  Problem Relation Age of Onset  . Kidney disease Father     has been on dialysis for years  . Diabetes Father     ? -unsure  . Leukemia Mother   . Diabetes Mother   . Arthritis Maternal Grandfather   . Arthritis Maternal Grandmother   . Arthritis Paternal Grandfather   . Arthritis Paternal Grandmother    History   Social History  . Marital Status: Married    Spouse Name: N/A    Number of Children: N/A  . Years of Education: N/A   Occupational History  . HVAC installation    Social History Main Topics  . Smoking status: Never Smoker   . Smokeless tobacco: Not on file  . Alcohol Use: Yes     Comment: rarely  . Drug Use: No  . Sexually Active: Not on file   Other Topics Concern  . 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 amLODipine (NORVASC) 5 MG tablet Take 1 tablet (5 mg total) by mouth daily.  30 tablet  11  . losartan (COZAAR) 50 MG tablet Take 1 tablet (50 mg total) by mouth daily.  30 tablet  11  . Multiple Vitamin (MULTIVITAMIN) tablet Take 1 tablet by mouth daily.            Review of Systems System review is negative for any constitutional, cardiac, pulmonary, GI or neuro symptoms or complaints other than as described in the HPI.     Objective:   Physical Exam Filed Vitals:   11/19/12 0939  BP: 132/80  Pulse: 80  Temp: 97.5 F (36.4 C)   Gen'l- heavy set white man in no acute distress HEENT- no sinus tenderness, mild posterior pharyngeal eryrthema Cor- RRR Pulm - CTAP       Assessment & Plan:  URI - mostly sinus. No evidence of pneumonia.  Plan -  z-pak  Robitussin DM for the cough - 1 tsp every 4-6 hours  Hydrate well, take vitamin C

## 2012-11-19 NOTE — Patient Instructions (Addendum)
URI - mostly sinus. No evidence of pneumonia.  Plan -  z-pak  Robitussin DM for the cough - 1 tsp every 4-6 hours  Hydrate well, take vitamin C   Upper Respiratory Infection, Adult An upper respiratory infection (URI) is also sometimes known as the common cold. The upper respiratory tract includes the nose, sinuses, throat, trachea, and bronchi. Bronchi are the airways leading to the lungs. Most people improve within 1 week, but symptoms can last up to 2 weeks. A residual cough may last even longer.   CAUSES Many different viruses can infect the tissues lining the upper respiratory tract. The tissues become irritated and inflamed and often become very moist. Mucus production is also common. A cold is contagious. You can easily spread the virus to others by oral contact. This includes kissing, sharing a glass, coughing, or sneezing. Touching your mouth or nose and then touching a surface, which is then touched by another person, can also spread the virus. SYMPTOMS   Symptoms typically develop 1 to 3 days after you come in contact with a cold virus. Symptoms vary from person to person. They may include:  Runny nose.   Sneezing.   Nasal congestion.   Sinus irritation.   Sore throat.   Loss of voice (laryngitis).   Cough.   Fatigue.   Muscle aches.   Loss of appetite.   Headache.   Low-grade fever.  DIAGNOSIS   You might diagnose your own cold based on familiar symptoms, since most people get a cold 2 to 3 times a year. Your caregiver can confirm this based on your exam. Most importantly, your caregiver can check that your symptoms are not due to another disease such as strep throat, sinusitis, pneumonia, asthma, or epiglottitis. Blood tests, throat tests, and X-rays are not necessary to diagnose a common cold, but they may sometimes be helpful in excluding other more serious diseases. Your caregiver will decide if any further tests are required. RISKS AND COMPLICATIONS   You may  be at risk for a more severe case of the common cold if you smoke cigarettes, have chronic heart disease (such as heart failure) or lung disease (such as asthma), or if you have a weakened immune system. The very young and very old are also at risk for more serious infections. Bacterial sinusitis, middle ear infections, and bacterial pneumonia can complicate the common cold. The common cold can worsen asthma and chronic obstructive pulmonary disease (COPD). Sometimes, these complications can require emergency medical care and may be life-threatening. PREVENTION   The best way to protect against getting a cold is to practice good hygiene. Avoid oral or hand contact with people with cold symptoms. Wash your hands often if contact occurs. There is no clear evidence that vitamin C, vitamin E, echinacea, or exercise reduces the chance of developing a cold. However, it is always recommended to get plenty of rest and practice good nutrition. TREATMENT   Treatment is directed at relieving symptoms. There is no cure. Antibiotics are not effective, because the infection is caused by a virus, not by bacteria. Treatment may include:  Increased fluid intake. Sports drinks offer valuable electrolytes, sugars, and fluids.   Breathing heated mist or steam (vaporizer or shower).   Eating chicken soup or other clear broths, and maintaining good nutrition.   Getting plenty of rest.   Using gargles or lozenges for comfort.   Controlling fevers with ibuprofen or acetaminophen as directed by your caregiver.   Increasing usage of  your inhaler if you have asthma.  Zinc gel and zinc lozenges, taken in the first 24 hours of the common cold, can shorten the duration and lessen the severity of symptoms. Pain medicines may help with fever, muscle aches, and throat pain. A variety of non-prescription medicines are available to treat congestion and runny nose. Your caregiver can make recommendations and may suggest nasal or  lung inhalers for other symptoms.   HOME CARE INSTRUCTIONS    Only take over-the-counter or prescription medicines for pain, discomfort, or fever as directed by your caregiver.   Use a warm mist humidifier or inhale steam from a shower to increase air moisture. This may keep secretions moist and make it easier to breathe.   Drink enough water and fluids to keep your urine clear or pale yellow.   Rest as needed.   Return to work when your temperature has returned to normal or as your caregiver advises. You may need to stay home longer to avoid infecting others. You can also use a face mask and careful hand washing to prevent spread of the virus.  SEEK MEDICAL CARE IF:    After the first few days, you feel you are getting worse rather than better.   You need your caregiver's advice about medicines to control symptoms.   You develop chills, worsening shortness of breath, or brown or red sputum. These may be signs of pneumonia.   You develop yellow or brown nasal discharge or pain in the face, especially when you bend forward. These may be signs of sinusitis.   You develop a fever, swollen neck glands, pain with swallowing, or white areas in the back of your throat. These may be signs of strep throat.  SEEK IMMEDIATE MEDICAL CARE IF:    You have a fever.   You develop severe or persistent headache, ear pain, sinus pain, or chest pain.   You develop wheezing, a prolonged cough, cough up blood, or have a change in your usual mucus (if you have chronic lung disease).   You develop sore muscles or a stiff neck.  Document Released: 04/21/2001 Document Revised: 01/18/2012 Document Reviewed: 02/27/2011 [facility] Patient Information 2013 [place], [place].

## 2012-11-21 ENCOUNTER — Telehealth: Payer: Self-pay | Admitting: Family Medicine

## 2012-11-21 NOTE — Telephone Encounter (Signed)
[facility] Triage Call Report Triage Record Num: [id number] Operator: Frederico Hamman Patient Name: Raymond West Call Date & Time: 11/18/2012 5:51:17PM Patient Phone: [phone] PCP: Idamae Schuller A. Tower Patient Gender: Male PCP Fax : Patient DOB: Aug 04, 1976 Practice Name: [facility] Reason for Call: Caller: [name]/Patient; PCP: Roxy Manns [employer]); CB#: [phone]; Call regarding Cough/Congestion; Raymond states he has onset of cough on 11/15/11. Nasal congestion- clear to yellow on 11/15/12. Stuffy in AM. STates wife was seen in office on 11/18/12 and diagnosed with sinus infection. Cahd states Dr. Milinda Antis told wife to have Raymond call and schedule appointment on 11/19/12 at [facility] Declined triage. Appointment scheduled for 10:00 on 11/19/12 at the [place] office. Protocol(s) Used: PCP Calls, No Triage (Adult) Recommended Outcome per Protocol: Call Provider within 72 Hours Reason for Outcome: Caller requesting an appointment, triage offered and declined Care Advice: ~ 11/18/2012 6:09:07PM Page 1 of 1 [id number]

## 2012-11-21 NOTE — Telephone Encounter (Signed)
He was seen at [facility]

## 2012-12-05 ENCOUNTER — Telehealth: Payer: Self-pay | Admitting: *Deleted

## 2012-12-05 NOTE — Telephone Encounter (Signed)
Pt dropped off Life Insurance forms to be completed.  Please call when ready to pick up  [phone].

## 2012-12-05 NOTE — Telephone Encounter (Signed)
Forms in your Inbox

## 2012-12-07 DIAGNOSIS — Z0279 Encounter for issue of other medical certificate: Secondary | ICD-10-CM

## 2012-12-07 NOTE — Telephone Encounter (Signed)
Pt notified form is ready for pickup.

## 2012-12-07 NOTE — Telephone Encounter (Signed)
Done- will be in IN box for pick up

## 2012-12-24 ENCOUNTER — Other Ambulatory Visit: Payer: Self-pay

## 2013-01-05 ENCOUNTER — Telehealth: Payer: Self-pay | Admitting: Family Medicine

## 2013-01-05 NOTE — Telephone Encounter (Signed)
I just got it - they asked for last 3 office notes and labs- I indicated on the form to please send that info

## 2013-01-05 NOTE — Telephone Encounter (Signed)
Caller: Morgan/Spouse; Phone: [phone]; Reason for Call: Caller states that Dr Milinda Antis filled out information for life insurance.  Caller states that she received a letter that states the life insurance company needs additional information from Dr [employer].  Caller wants to know if Dr Milinda Antis has received the notification from the life insurance company and if the additional information has been sent back in.  Caller reports she only has 30 days to complete the paperwork process.  OFFICE PLEASE FOLLOW UP WITH CALLER.

## 2013-01-06 NOTE — Telephone Encounter (Signed)
Left voicemail letting pt's wife know we have already faxed over the additional info

## 2013-05-29 ENCOUNTER — Ambulatory Visit (INDEPENDENT_AMBULATORY_CARE_PROVIDER_SITE_OTHER): Payer: BC Managed Care – PPO | Admitting: Family Medicine

## 2013-05-29 ENCOUNTER — Encounter: Payer: Self-pay | Admitting: Family Medicine

## 2013-05-29 VITALS — BP 126/78 | HR 64 | Temp 98.7°F | Ht 70.5 in | Wt 252.5 lb

## 2013-05-29 DIAGNOSIS — I10 Essential (primary) hypertension: Secondary | ICD-10-CM

## 2013-05-29 DIAGNOSIS — Z0000 Encounter for general adult medical examination without abnormal findings: Secondary | ICD-10-CM

## 2013-05-29 LAB — LIPID PANEL
Cholesterol: 169 mg/dL (ref 0–200)
HDL: 34.4 mg/dL — ABNORMAL LOW (ref >39.00)
Triglycerides: 207 mg/dL — ABNORMAL HIGH (ref 0.0–149.0)
VLDL: 41.4 mg/dL — ABNORMAL HIGH (ref 0.0–40.0)

## 2013-05-29 LAB — CBC WITH DIFFERENTIAL/PLATELET
Basophils Relative: 0.2 % (ref 0.0–3.0)
Eosinophils Relative: 2 % (ref 0.0–5.0)
HCT: 43.2 % (ref 39.0–52.0)
Lymphs Abs: 2.9 10*3/uL (ref 0.7–4.0)
MCHC: 34.3 g/dL (ref 30.0–36.0)
MCV: 86.8 fl (ref 78.0–100.0)
Monocytes Absolute: 0.7 10*3/uL (ref 0.1–1.0)
RBC: 4.97 Mil/uL (ref 4.22–5.81)
WBC: 10.1 10*3/uL (ref 4.5–10.5)

## 2013-05-29 LAB — TSH: TSH: 1.72 u[IU]/mL (ref 0.35–5.50)

## 2013-05-29 LAB — COMPREHENSIVE METABOLIC PANEL
Albumin: 4.3 g/dL (ref 3.5–5.2)
Alkaline Phosphatase: 50 U/L (ref 39–117)
BUN: 17 mg/dL (ref 6–23)
Creatinine, Ser: 1.3 mg/dL (ref 0.4–1.5)
Glucose, Bld: 78 mg/dL (ref 70–99)
Total Bilirubin: 0.5 mg/dL (ref 0.3–1.2)

## 2013-05-29 MED ORDER — LOSARTAN POTASSIUM 50 MG PO TABS: 50.0000 mg | ORAL_TABLET | Freq: Every day | ORAL | Status: DC

## 2013-05-29 MED ORDER — AMLODIPINE BESYLATE 5 MG PO TABS: 5.0000 mg | ORAL_TABLET | Freq: Every day | ORAL | Status: DC

## 2013-05-29 NOTE — Assessment & Plan Note (Signed)
Reviewed health habits including diet and exercise and skin cancer prevention Also reviewed health mt list, fam hx and immunizations  Wellness lab today  Recommend annual flu vaccine in season Disc need for wt loss for better health

## 2013-05-29 NOTE — Patient Instructions (Addendum)
I am glad you are doing well  I do recommend getting a flu shot each fall  No change in medicines Keep working on weight loss  Do extra exercise when you can Labs today  Schedule next physical in a year

## 2013-05-29 NOTE — Progress Notes (Signed)
Subjective:    Patient ID: Raymond West, male    DOB: 1976-09-18, 37 y.o.   MRN: [id number]  HPI Here for health maintenance exam and to review chronic medical problems   Working a lot lately  Feeling good overall - tired this time of year   Wt is down 7 lb with bmi of 35 Eating a fairly healthy diet - meat and vegetables - and avoid fried foods  2 hours ago - ate a meal with stewed beef and mashed pot  Sweet tea   bp is stable today  No cp or palpitations or headaches or edema  No side effects to medicines  BP Readings from Last 3 Encounters:  05/29/13 126/78  11/19/12 132/80  05/27/12 122/80    Very good control   Flu vaccine-does not get them  Td 9/11   fam hx -no new fam hx  No cancer   Mood- is pretty cheerful -all the time/ no depression   Labs- are due  Lab Results  Component Value Date   CHOL 163 05/27/2012   CHOL 166 05/22/2011   CHOL 163 01/15/2010   Lab Results  Component Value Date   HDL 34* 05/27/2012   HDL 37* 05/22/2011   HDL 39.40 01/15/2010   Lab Results  Component Value Date   LDLCALC 90 05/27/2012   LDLCALC 110* 05/22/2011   LDLCALC 92 01/15/2010   Lab Results  Component Value Date   TRIG 194* 05/27/2012   TRIG 94 05/22/2011   TRIG 160.0* 01/15/2010   Lab Results  Component Value Date   CHOLHDL 4.8 05/27/2012   CHOLHDL 4.5 05/22/2011   CHOLHDL 4 01/15/2010   No results found for this basename: LDLDIRECT     Patient Active Problem List   Diagnosis Date Noted  . Routine general medical examination at a health care facility 05/27/2012  . NEOPLASM, SKIN, UNCERTAIN BEHAVIOR 01/31/2010  . ESSENTIAL HYPERTENSION 01/15/2010   Past Medical History  Diagnosis Date  . Unspecified essential hypertension   . Neoplasm of uncertain behavior of skin    No past surgical history on file. History  Substance Use Topics  . Smoking status: Never Smoker   . Smokeless tobacco: Not on file  . Alcohol Use: Yes     Comment: rarely   Family History  Problem  Relation Age of Onset  . Kidney disease Father     has been on dialysis for years  . Diabetes Father     ? -unsure  . Leukemia Mother   . Diabetes Mother   . Arthritis Maternal Grandfather   . Arthritis Maternal Grandmother   . Arthritis Paternal Grandfather   . Arthritis Paternal Grandmother    Allergies  Allergen Reactions  . Amoxicillin     REACTION: rash  . Benazepril Cough   Current Outpatient Prescriptions on File Prior to Visit  Medication Sig Dispense Refill  . Multiple Vitamin (MULTIVITAMIN) tablet Take 1 tablet by mouth daily.         No current facility-administered medications on file prior to visit.    Review of Systems Review of Systems  Constitutional: Negative for fever, appetite change, fatigue and unexpected weight change.  Eyes: Negative for pain and visual disturbance.  Respiratory: Negative for cough and shortness of breath.   Cardiovascular: Negative for cp or palpitations    Gastrointestinal: Negative for nausea, diarrhea and constipation.  Genitourinary: Negative for urgency and frequency.  Skin: Negative for pallor or rash  no new or changing  moles  Neurological: Negative for weakness, light-headedness, numbness and headaches.  Hematological: Negative for adenopathy. Does not bruise/bleed easily.  Psychiatric/Behavioral: Negative for dysphoric mood. The patient is not nervous/anxious.         Objective:   Physical Exam  Constitutional: He appears well-developed and well-nourished. No distress.  obese and well appearing   HENT:  Head: Normocephalic and atraumatic.  Right Ear: External ear normal.  Left Ear: External ear normal.  Nose: Nose normal.  Mouth/Throat: Oropharynx is clear and moist.  Eyes: Conjunctivae and EOM are normal. Pupils are equal, round, and reactive to light. Right eye exhibits no discharge. Left eye exhibits no discharge. No scleral icterus.  Neck: Normal range of motion. Neck supple. No JVD present. Carotid bruit is not  present. No thyromegaly present.  Cardiovascular: Normal rate, regular rhythm, normal heart sounds and intact distal pulses.  Exam reveals no gallop.   Pulmonary/Chest: Effort normal and breath sounds normal. No respiratory distress. He has no wheezes. He has no rales.  Abdominal: Soft. Bowel sounds are normal. He exhibits no distension, no abdominal bruit and no mass. There is no tenderness.  Musculoskeletal: Normal range of motion. He exhibits no edema and no tenderness.  Lymphadenopathy:    He has no cervical adenopathy.  Neurological: He is alert. He has normal reflexes. No cranial nerve deficit. He exhibits normal muscle tone. Coordination normal.  Skin: Skin is warm and dry. No rash noted. No erythema. No pallor.  Fair complexion with lentigos  Psychiatric: He has a normal mood and affect.  Cheerful and talkative          Assessment & Plan:

## 2013-05-29 NOTE — Assessment & Plan Note (Signed)
bp in fair control at this time  No changes needed  Disc lifstyle change with low sodium diet and exercise  Urged to work on diet and add more exercise for wt loss Lab today

## 2013-09-14 ENCOUNTER — Other Ambulatory Visit: Payer: Self-pay

## 2013-12-01 ENCOUNTER — Ambulatory Visit (INDEPENDENT_AMBULATORY_CARE_PROVIDER_SITE_OTHER): Payer: 59 | Admitting: Family Medicine

## 2013-12-01 ENCOUNTER — Encounter: Payer: Self-pay | Admitting: Family Medicine

## 2013-12-01 VITALS — BP 130/78 | HR 68 | Temp 97.7°F | Ht 70.5 in | Wt 254.0 lb

## 2013-12-01 DIAGNOSIS — Z23 Encounter for immunization: Secondary | ICD-10-CM

## 2013-12-01 DIAGNOSIS — I10 Essential (primary) hypertension: Secondary | ICD-10-CM

## 2013-12-01 NOTE — Patient Instructions (Signed)
Your blood pressure is good today Continue dosing at night  Work on diet and exercise and weight loss Flu vaccine today  Take care of yourself  Follow up in 6 months with labs prior

## 2013-12-01 NOTE — Progress Notes (Signed)
Pre-visit discussion using our clinic review tool. No additional management support is needed unless otherwise documented below in the visit note.  

## 2013-12-01 NOTE — Assessment & Plan Note (Signed)
Doing better dosing his losartan and amlodipine at night  bp in fair control at this time  No changes needed Disc lifstyle change with low sodium diet and exercise    F/u 6 mo

## 2013-12-01 NOTE — Progress Notes (Signed)
   Subjective:    Patient ID: Raymond West, male    DOB: September 18, 1976, 38 y.o.   MRN: [id number]  HPI Here for f/u of HTN  Wt is up 2 lb with bmi of 35  Staying active with work - very physical job  Is working on wt loss with his wife -- lot of grilled chicken  Cut out most [place] food and junk food  Cooks at home during the week    bp is stable today  No cp or palpitations or headaches or edema  No side effects to medicines -- he thougth he was drowsy with med in am - so switched to pm dosing Sleeping ok  BP Readings from Last 3 Encounters:  12/01/13 130/78  05/29/13 126/78  11/19/12 132/80     Flu vaccine - will do today     Chemistry      Component Value Date/Time   NA 140 05/29/2013 1503   K 4.2 05/29/2013 1503   CL 104 05/29/2013 1503   CO2 28 05/29/2013 1503   BUN 17 05/29/2013 1503   CREATININE 1.3 05/29/2013 1503   CREATININE 0.96 05/27/2012 1509      Component Value Date/Time   CALCIUM 9.3 05/29/2013 1503   ALKPHOS 50 05/29/2013 1503   AST 27 05/29/2013 1503   ALT 32 05/29/2013 1503   BILITOT 0.5 05/29/2013 1503     Lab Results  Component Value Date   CHOL 169 05/29/2013   HDL 34.40* 05/29/2013   LDLCALC 90 05/27/2012   LDLDIRECT 118.5 05/29/2013   TRIG 207.0* 05/29/2013   CHOLHDL 5 05/29/2013      Review of Systems Review of Systems  Constitutional: Negative for fever, appetite change, fatigue and unexpected weight change.  Eyes: Negative for pain and visual disturbance.  Respiratory: Negative for cough and shortness of breath.   Cardiovascular: Negative for cp or palpitations    Gastrointestinal: Negative for nausea, diarrhea and constipation.  Genitourinary: Negative for urgency and frequency.  Skin: Negative for pallor or rash   Neurological: Negative for weakness, light-headedness, numbness and headaches.  Hematological: Negative for adenopathy. Does not bruise/bleed easily.  Psychiatric/Behavioral: Negative for dysphoric mood. The patient is not  nervous/anxious.         Objective:   Physical Exam  Constitutional: He appears well-developed and well-nourished. No distress.  HENT:  Head: Normocephalic and atraumatic.  Mouth/Throat: Oropharynx is clear and moist.  Eyes: Conjunctivae and EOM are normal. Pupils are equal, round, and reactive to light. Right eye exhibits no discharge. Left eye exhibits no discharge. No scleral icterus.  Neck: Normal range of motion. Neck supple. No JVD present. Carotid bruit is not present. No thyromegaly present.  Cardiovascular: Normal rate, regular rhythm, normal heart sounds and intact distal pulses.  Exam reveals no gallop.   Pulmonary/Chest: Effort normal and breath sounds normal. No respiratory distress. He has no wheezes. He has no rales.  No crackles   Musculoskeletal: He exhibits no edema.  Lymphadenopathy:    He has no cervical adenopathy.  Neurological: He is alert. He has normal reflexes. No cranial nerve deficit. He exhibits normal muscle tone. Coordination normal.  Skin: Skin is warm and dry. No rash noted. No erythema. No pallor.  Psychiatric: He has a normal mood and affect.          Assessment & Plan:

## 2013-12-13 ENCOUNTER — Telehealth: Payer: Self-pay | Admitting: Family Medicine

## 2013-12-13 NOTE — Telephone Encounter (Signed)
Relevant patient education mailed to patient.  

## 2014-03-03 ENCOUNTER — Ambulatory Visit (INDEPENDENT_AMBULATORY_CARE_PROVIDER_SITE_OTHER): Payer: 59 | Admitting: Family Medicine

## 2014-03-03 ENCOUNTER — Emergency Department (HOSPITAL_COMMUNITY)
Admission: EM | Admit: 2014-03-03 | Discharge: 2014-03-03 | Disposition: A | Discharge status: Home / Self Care | Payer: 59 | Attending: Emergency Medicine | Admitting: Emergency Medicine

## 2014-03-03 ENCOUNTER — Encounter: Payer: Self-pay | Admitting: Family Medicine

## 2014-03-03 ENCOUNTER — Encounter (HOSPITAL_COMMUNITY): Payer: Self-pay | Admitting: Emergency Medicine

## 2014-03-03 VITALS — BP 128/72 | HR 68 | Temp 96.5°F | Wt 261.0 lb

## 2014-03-03 DIAGNOSIS — Z880 Allergy status to penicillin: Secondary | ICD-10-CM | POA: Insufficient documentation

## 2014-03-03 DIAGNOSIS — M79609 Pain in unspecified limb: Secondary | ICD-10-CM

## 2014-03-03 DIAGNOSIS — I10 Essential (primary) hypertension: Secondary | ICD-10-CM | POA: Insufficient documentation

## 2014-03-03 DIAGNOSIS — L02419 Cutaneous abscess of limb, unspecified: Secondary | ICD-10-CM | POA: Insufficient documentation

## 2014-03-03 DIAGNOSIS — Z85828 Personal history of other malignant neoplasm of skin: Secondary | ICD-10-CM | POA: Insufficient documentation

## 2014-03-03 DIAGNOSIS — Z79899 Other long term (current) drug therapy: Secondary | ICD-10-CM | POA: Insufficient documentation

## 2014-03-03 DIAGNOSIS — M7989 Other specified soft tissue disorders: Secondary | ICD-10-CM

## 2014-03-03 DIAGNOSIS — L03119 Cellulitis of unspecified part of limb: Secondary | ICD-10-CM

## 2014-03-03 MED ORDER — CEPHALEXIN 500 MG PO CAPS
500.0000 mg | ORAL_CAPSULE | Freq: Once | ORAL | Status: AC
  Administered 2014-03-03: 500 mg via ORAL
  Filled 2014-03-03: qty 1

## 2014-03-03 MED ORDER — SULFAMETHOXAZOLE-TRIMETHOPRIM 800-160 MG PO TABS: 1.0000 | ORAL_TABLET | Freq: Two times a day (BID) | ORAL | Status: DC

## 2014-03-03 MED ORDER — CEPHALEXIN 500 MG PO CAPS: 500.0000 mg | ORAL_CAPSULE | Freq: Four times a day (QID) | ORAL | Status: DC

## 2014-03-03 MED ORDER — SULFAMETHOXAZOLE-TMP DS 800-160 MG PO TABS
1.0000 | ORAL_TABLET | Freq: Once | ORAL | Status: AC
  Administered 2014-03-03: 1 via ORAL
  Filled 2014-03-03: qty 1

## 2014-03-03 NOTE — ED Provider Notes (Signed)
CSN: [id number]     Arrival date & time 03/03/14  1023 History   First MD Initiated Contact with Patient 03/03/14 1043     Chief Complaint  Patient presents with  . Leg Pain     HPI Patient presents emergency department because of increasing leg pain or the past 2 weeks.  He was sent from his primary care doctor's office today to evaluate for the possibility of venous thromboembolic disease.  No prior history of DVT or pulmonary embolism.  Patient reports that he initially injured his left leg when he kneels and a crawl space at work and felt something sharp in his leg.  He initially had no issues or complaints but then over the past 2 weeks has developed redness with warmth and increasing pain and swelling to his left lower leg.  No recent travel or immobilization.  He continues to work.  His pain is worse when he kneels on his left leg.  Without pressure or palpation in this area he has no significant pain.  Stable and really without any difficulty.  No history of vasculitis.  No fevers or chills.  No numbness or weakness of his lower extremity.   Past Medical History  Diagnosis Date  . Unspecified essential hypertension   . Neoplasm of uncertain behavior of skin    History reviewed. No pertinent past surgical history. Family History  Problem Relation Age of Onset  . Kidney disease Father     has been on dialysis for years  . Diabetes Father     ? -unsure  . Leukemia Mother   . Diabetes Mother   . Arthritis Maternal Grandfather   . Arthritis Maternal Grandmother   . Arthritis Paternal Grandfather   . Arthritis Paternal Grandmother    History  Substance Use Topics  . Smoking status: Never Smoker   . Smokeless tobacco: Not on file  . Alcohol Use: Yes     Comment: rarely    Review of Systems  All other systems reviewed and are negative.     Allergies  Amoxicillin; Benazepril; and Penicillins  Home Medications   Prior to Admission medications   Medication Sig Start  Date End Date Taking? Authorizing Provider  amLODipine (NORVASC) 5 MG tablet Take 1 tablet (5 mg total) by mouth daily. 05/29/13  Yes Abner Greenspan, MD  ibuprofen (ADVIL,MOTRIN) 200 MG tablet Take 400 mg by mouth every 6 (six) hours as needed for moderate pain.   Yes Historical Provider, MD  losartan (COZAAR) 50 MG tablet Take 1 tablet (50 mg total) by mouth daily. 05/29/13  Yes Abner Greenspan, MD  Multiple Vitamin (MULTIVITAMIN) tablet Take 1 tablet by mouth daily.     Yes Historical Provider, MD   BP 121/72  Pulse 62  Temp(Src) 98.4 F (36.9 C) (Oral)  Resp 16  SpO2 99% Physical Exam  Nursing note and vitals reviewed. Constitutional: He is oriented to person, place, and time. He appears well-developed and well-nourished.  HENT:  Head: Normocephalic.  Eyes: EOM are normal.  Neck: Normal range of motion.  Pulmonary/Chest: Effort normal.  Abdominal: He exhibits no distension.  Musculoskeletal: Normal range of motion.  Patient with palpable swelling of his left lower extremity as compared to his right.  There is erythema and warmth to the anterior aspect of his left lower leg.  There is a small amount of bruising noted in the proximal one third of the anterior tibia region.  Normal PT and DP pulse in left  foot.  No weakness noted in major muscle groups of both lower extremity.  Full range of motion of left ankle left knee and left hip.  No swelling or tenderness of his left thigh.  Neurological: He is alert and oriented to person, place, and time.  Psychiatric: He has a normal mood and affect.    ED Course  Procedures (including critical care time) Labs Review Labs Reviewed - No data to display  Imaging Review No results found.   EKG Interpretation None      MDM   Final diagnoses:  Cellulitis of lower leg    Better at this time.  Home with antibiotics for cellulitis.  Lower commit duplex of the left negative for DVT.  Well-appearing.  Discharge home in good  condition.    Hoy Morn, MD 03/03/14 [phone]

## 2014-03-03 NOTE — Progress Notes (Signed)
   Subjective:    Patient ID: Raymond West, male    DOB: Dec 19, 1975, 38 y.o.   MRN: [id number]  HPI Here for 2 weeks of pain and swelling in the left lower leg. No redness or warmth. No swelling in the right leg. No chest pain or SOB. No recent travel or trauma.   Review of Systems  Constitutional: Negative.   Respiratory: Negative.   Cardiovascular: Positive for leg swelling. Negative for chest pain and palpitations.       Objective:   Physical Exam  Constitutional: He appears well-developed and well-nourished. No distress.  Cardiovascular: Normal rate, regular rhythm, normal heart sounds and intact distal pulses.   Pulmonary/Chest: Effort normal and breath sounds normal. No respiratory distress. He has no wheezes. He has no rales.  Musculoskeletal:  Left lower leg is tight and swollen from the knee down to the foot. He is tender in the calf. No cords felt. No redness or warmth.           Assessment & Plan:  Possible DVT. He will go from here directly to [facility] ER to be evaluated

## 2014-03-03 NOTE — Progress Notes (Signed)
VASCULAR LAB PRELIMINARY  PRELIMINARY  PRELIMINARY  PRELIMINARY  Left lower extremity venous Doppler completed.    Preliminary report:  There is no DVT or SVT noted in the left lower extremity.  There is an enlarged lymph node noted in the left groin.  Iantha Fallen, RVT 03/03/2014, 1:16 PM

## 2014-03-03 NOTE — Discharge Instructions (Signed)
Cellulitis Cellulitis is an infection of the skin and the tissue beneath it. The infected area is usually red and tender. Cellulitis occurs most often in the arms and lower legs.  CAUSES  Cellulitis is caused by bacteria that enter the skin through cracks or cuts in the skin. The most common types of bacteria that cause cellulitis are Staphylococcus and Streptococcus. SYMPTOMS   Redness and warmth.  Swelling.  Tenderness or pain.  Fever. DIAGNOSIS  Your caregiver can usually determine what is wrong based on a physical exam. Blood tests may also be done. TREATMENT  Treatment usually involves taking an antibiotic medicine. HOME CARE INSTRUCTIONS   Take your antibiotics as directed. Finish them even if you start to feel better.  Keep the infected arm or leg elevated to reduce swelling.  Apply a warm cloth to the affected area up to 4 times per day to relieve pain.  Only take over-the-counter or prescription medicines for pain, discomfort, or fever as directed by your caregiver.  Keep all follow-up appointments as directed by your caregiver. SEEK MEDICAL CARE IF:   You notice red streaks coming from the infected area.  Your red area gets larger or turns dark in color.  Your bone or joint underneath the infected area becomes painful after the skin has healed.  Your infection returns in the same area or another area.  You notice a swollen bump in the infected area.  You develop new symptoms. SEEK IMMEDIATE MEDICAL CARE IF:   You have a fever.  You feel very sleepy.  You develop vomiting or diarrhea.  You have a general ill feeling (malaise) with muscle aches and pains. MAKE SURE YOU:   Understand these instructions.  Will watch your condition.  Will get help right away if you are not doing well or get worse. Document Released: 08/05/2005 Document Revised: 04/26/2012 Document Reviewed: 01/11/2012 [employer] Patient Information 2014 [employer].  

## 2014-03-03 NOTE — ED Notes (Signed)
Pt from [place], c/o L leg pain, from knee to shin. Pt was referred here to r/o possible blood clot. Pt states that he has had this pain x3 week, but the past few days, has turned red, with heat and swollen. Pt denies CP, SOB, N/V/D. Pt is A&O and in NAD. Pt denies hx of blood clots

## 2014-03-06 ENCOUNTER — Encounter: Payer: Self-pay | Admitting: Family Medicine

## 2014-03-06 ENCOUNTER — Ambulatory Visit (INDEPENDENT_AMBULATORY_CARE_PROVIDER_SITE_OTHER): Payer: 59 | Admitting: Family Medicine

## 2014-03-06 VITALS — BP 116/72 | HR 71 | Temp 97.6°F | Ht 70.5 in | Wt 259.5 lb

## 2014-03-06 DIAGNOSIS — L03116 Cellulitis of left lower limb: Secondary | ICD-10-CM | POA: Insufficient documentation

## 2014-03-06 DIAGNOSIS — L02419 Cutaneous abscess of limb, unspecified: Secondary | ICD-10-CM

## 2014-03-06 DIAGNOSIS — L03119 Cellulitis of unspecified part of limb: Secondary | ICD-10-CM

## 2014-03-06 NOTE — Progress Notes (Signed)
Pre visit review using our clinic review tool, if applicable. No additional management support is needed unless otherwise documented below in the visit note. 

## 2014-03-06 NOTE — Patient Instructions (Signed)
Stop the cephalexin- (keflex)-you may be allergic to it  If rash worsens or any wheezing or mouth swelling - get to ER and call us immediately  Continue the other antibiotic (sulfa )-until it is gone - unless allergic reaction worsens  Wear some sunscreen-the antibiotic will make you burn much more easily Update if not starting to improve in a week or if worsening

## 2014-03-06 NOTE — Progress Notes (Signed)
Subjective:    Patient ID: Raymond West, male    DOB: Jun 02, 1976, 38 y.o.   MRN: [id number]  HPI Raymond West in to Sat clinic - and saw Dr Sarajane Jews and was sent to hosp for leg pain L  susp DVT - doppler was neg for that  Dx with cellulitis - unsure how that started   He did while working kneeled on a zip tie - does not think it broke the skin  Whole front of his shin became red and swollen  Knee swelled up also   Leg has gone down  Is improved  Still thinks he feels a knot when he gets down on his knee   Has broken out in a rash on his ankles and legs starting yesterday - hives Not too itchy  Took benadryl   He is eager to get back to work   Patient Active Problem List   Diagnosis Date Noted  . Routine general medical examination at a health care facility 05/27/2012  . ESSENTIAL HYPERTENSION 01/15/2010   Past Medical History  Diagnosis Date  . Unspecified essential hypertension   . Neoplasm of uncertain behavior of skin    No past surgical history on file. History  Substance Use Topics  . Smoking status: Never Smoker   . Smokeless tobacco: Not on file  . Alcohol Use: Yes     Comment: rarely   Family History  Problem Relation Age of Onset  . Kidney disease Father     has been on dialysis for years  . Diabetes Father     ? -unsure  . Leukemia Mother   . Diabetes Mother   . Arthritis Maternal Grandfather   . Arthritis Maternal Grandmother   . Arthritis Paternal Grandfather   . Arthritis Paternal Grandmother    Allergies  Allergen Reactions  . Amoxicillin     REACTION: rash  . Benazepril Cough  . Penicillins Rash   Current Outpatient Prescriptions on File Prior to Visit  Medication Sig Dispense Refill  . amLODipine (NORVASC) 5 MG tablet Take 1 tablet (5 mg total) by mouth daily.  30 tablet  11  . cephALEXin (KEFLEX) 500 MG capsule Take 1 capsule (500 mg total) by mouth 4 (four) times daily.  28 capsule  0  . ibuprofen (ADVIL,MOTRIN) 200 MG tablet Take 400 mg by  mouth every 6 (six) hours as needed for moderate pain.      Marland Kitchen losartan (COZAAR) 50 MG tablet Take 1 tablet (50 mg total) by mouth daily.  30 tablet  11  . Multiple Vitamin (MULTIVITAMIN) tablet Take 1 tablet by mouth daily.        Marland Kitchen sulfamethoxazole-trimethoprim (SEPTRA DS) 800-160 MG per tablet Take 1 tablet by mouth every 12 (twelve) hours.  14 tablet  0   No current facility-administered medications on file prior to visit.     He is on sulfa and also keflex   has amox allergy in chart  Patient Active Problem List   Diagnosis Date Noted  . Cellulitis of leg, left 03/06/2014  . Routine general medical examination at a health care facility 05/27/2012  . ESSENTIAL HYPERTENSION 01/15/2010   Past Medical History  Diagnosis Date  . Unspecified essential hypertension   . Neoplasm of uncertain behavior of skin    No past surgical history on file. History  Substance Use Topics  . Smoking status: Never Smoker   . Smokeless tobacco: Not on file  . Alcohol Use: Yes  Comment: rarely   Family History  Problem Relation Age of Onset  . Kidney disease Father     has been on dialysis for years  . Diabetes Father     ? -unsure  . Leukemia Mother   . Diabetes Mother   . Arthritis Maternal Grandfather   . Arthritis Maternal Grandmother   . Arthritis Paternal Grandfather   . Arthritis Paternal Grandmother    Allergies  Allergen Reactions  . Amoxicillin     REACTION: rash  . Benazepril Cough  . Keflex [Cephalexin]     Rash   . Penicillins Rash   Current Outpatient Prescriptions on File Prior to Visit  Medication Sig Dispense Refill  . amLODipine (NORVASC) 5 MG tablet Take 1 tablet (5 mg total) by mouth daily.  30 tablet  11  . cephALEXin (KEFLEX) 500 MG capsule Take 1 capsule (500 mg total) by mouth 4 (four) times daily.  28 capsule  0  . ibuprofen (ADVIL,MOTRIN) 200 MG tablet Take 400 mg by mouth every 6 (six) hours as needed for moderate pain.      Marland Kitchen losartan (COZAAR) 50 MG  tablet Take 1 tablet (50 mg total) by mouth daily.  30 tablet  11  . Multiple Vitamin (MULTIVITAMIN) tablet Take 1 tablet by mouth daily.        Marland Kitchen sulfamethoxazole-trimethoprim (SEPTRA DS) 800-160 MG per tablet Take 1 tablet by mouth every 12 (twelve) hours.  14 tablet  0   No current facility-administered medications on file prior to visit.     Review of Systems  Review of Systems  Constitutional: Negative for fever, appetite change, fatigue and unexpected weight change.  Eyes: Negative for pain and visual disturbance.  Respiratory: Negative for cough and shortness of breath.   Cardiovascular: Negative for cp or palpitations    Gastrointestinal: Negative for nausea, diarrhea and constipation.  Genitourinary: Negative for urgency and frequency.  Skin: Negative for pallor and pos for rash Neurological: Negative for weakness, light-headedness, numbness and headaches.  Hematological: Negative for adenopathy. Does not bruise/bleed easily.  Psychiatric/Behavioral: Negative for dysphoric mood. The patient is not nervous/anxious.         Objective:   Physical Exam  Constitutional: He appears well-developed and well-nourished. No distress.  obese and well appearing   HENT:  Head: Normocephalic and atraumatic.  Mouth/Throat: Oropharynx is clear and moist.  No mouth or throat swelling   Eyes: Conjunctivae and EOM are normal. Pupils are equal, round, and reactive to light. Right eye exhibits no discharge. Left eye exhibits no discharge. No scleral icterus.  Neck: Normal range of motion. Neck supple. No tracheal deviation present.  Cardiovascular: Normal rate, regular rhythm and intact distal pulses.   Pulmonary/Chest: Effort normal and breath sounds normal. No respiratory distress. He has no wheezes. He has no rales.  Musculoskeletal: He exhibits no edema.  L leg is much imp No erythema or swelling or tenderness at this time  Lymphadenopathy:    He has no cervical adenopathy.    Neurological: He is alert. He has normal reflexes. No cranial nerve deficit. He exhibits normal muscle tone. Coordination normal.  Skin: Skin is warm and dry. Rash noted. There is erythema.  Erythematous whelps on legs and torso   Psychiatric: He has a normal mood and affect.          Assessment & Plan:

## 2014-03-07 NOTE — Assessment & Plan Note (Signed)
Much imp  Rev ED notes in detail  Suspect allergy to keflex (has pcn all) Will hold this and finish sulfa-however if rash persists or worsens will stop that and update

## 2014-05-30 ENCOUNTER — Other Ambulatory Visit: Payer: Self-pay | Admitting: Family Medicine

## 2014-06-08 ENCOUNTER — Ambulatory Visit (INDEPENDENT_AMBULATORY_CARE_PROVIDER_SITE_OTHER): Payer: 59 | Admitting: Family Medicine

## 2014-06-08 ENCOUNTER — Encounter: Payer: Self-pay | Admitting: Family Medicine

## 2014-06-08 VITALS — BP 128/84 | HR 62 | Temp 97.6°F | Ht 70.75 in | Wt 253.5 lb

## 2014-06-08 DIAGNOSIS — I10 Essential (primary) hypertension: Secondary | ICD-10-CM

## 2014-06-08 DIAGNOSIS — Z0000 Encounter for general adult medical examination without abnormal findings: Secondary | ICD-10-CM

## 2014-06-08 DIAGNOSIS — B353 Tinea pedis: Secondary | ICD-10-CM | POA: Insufficient documentation

## 2014-06-08 LAB — LIPID PANEL
Cholesterol: 164 mg/dL (ref 0–200)
HDL: 33.9 mg/dL — ABNORMAL LOW (ref >39.00)
LDL Cholesterol: 104 mg/dL — ABNORMAL HIGH (ref 0–99)
NonHDL: 130.1
TRIGLYCERIDES: 132 mg/dL (ref 0.0–149.0)
Total CHOL/HDL Ratio: 5
VLDL: 26.4 mg/dL (ref 0.0–40.0)

## 2014-06-08 LAB — COMPREHENSIVE METABOLIC PANEL
ALT: 35 U/L (ref 0–53)
AST: 27 U/L (ref 0–37)
Albumin: 4.5 g/dL (ref 3.5–5.2)
Alkaline Phosphatase: 53 U/L (ref 39–117)
BILIRUBIN TOTAL: 0.8 mg/dL (ref 0.2–1.2)
BUN: 17 mg/dL (ref 6–23)
CALCIUM: 9.3 mg/dL (ref 8.4–10.5)
CO2: 30 meq/L (ref 19–32)
CREATININE: 0.9 mg/dL (ref 0.4–1.5)
Chloride: 103 mEq/L (ref 96–112)
GFR: 100.14 mL/min (ref >60.00)
GLUCOSE: 91 mg/dL (ref 70–99)
Potassium: 4.4 mEq/L (ref 3.5–5.1)
Sodium: 139 mEq/L (ref 135–145)
Total Protein: 7.5 g/dL (ref 6.0–8.3)

## 2014-06-08 LAB — CBC WITH DIFFERENTIAL/PLATELET
BASOS ABS: 0 10*3/uL (ref 0.0–0.1)
BASOS PCT: 0.3 % (ref 0.0–3.0)
Eosinophils Absolute: 0.2 10*3/uL (ref 0.0–0.7)
Eosinophils Relative: 1.8 % (ref 0.0–5.0)
HEMATOCRIT: 46 % (ref 39.0–52.0)
HEMOGLOBIN: 15.7 g/dL (ref 13.0–17.0)
LYMPHS ABS: 3 10*3/uL (ref 0.7–4.0)
Lymphocytes Relative: 32.3 % (ref 12.0–46.0)
MCHC: 34.2 g/dL (ref 30.0–36.0)
MCV: 86.4 fl (ref 78.0–100.0)
Monocytes Absolute: 0.6 10*3/uL (ref 0.1–1.0)
Monocytes Relative: 6.8 % (ref 3.0–12.0)
Neutro Abs: 5.4 10*3/uL (ref 1.4–7.7)
Neutrophils Relative %: 58.8 % (ref 43.0–77.0)
Platelets: 213 10*3/uL (ref 150.0–400.0)
RBC: 5.33 Mil/uL (ref 4.22–5.81)
RDW: 13.4 % (ref 11.5–15.5)
WBC: 9.2 10*3/uL (ref 4.0–10.5)

## 2014-06-08 LAB — TSH: TSH: 0.99 u[IU]/mL (ref 0.35–4.50)

## 2014-06-08 MED ORDER — AMLODIPINE BESYLATE 5 MG PO TABS
ORAL_TABLET | ORAL | Status: DC
Start: 2014-06-08 — End: 2015-06-25

## 2014-06-08 MED ORDER — LOSARTAN POTASSIUM 50 MG PO TABS: ORAL_TABLET | ORAL | Status: DC

## 2014-06-08 NOTE — Progress Notes (Signed)
Subjective:    Patient ID: Raymond West, male    DOB: [birth year], 38 y.o.   MRN: [id number]  HPI Here for health maintenance exam and to review chronic medical problems    Doing well overall  Working a lot - has vacation planned in 2 weeks ([place])    Wt is down 6 lb with bmi of 35 Usually looses weight in the summer  Mood is good  Does have an exercise program - he has a physical job  Wife is pregnant-that is going well / happy  Flu vaccine 1/15 Td 9/11   Due for labs- will do today   Cholesterol - is fasting  Overall tries to eat at home -- likes grilled chicken , occ a steak (all the time)  Mood -very good/ stays motivated   bp is stable today  No cp or palpitations or headaches or edema  No side effects to medicines  BP Readings from Last 3 Encounters:  06/08/14 128/84  03/06/14 116/72  03/03/14 112/75    On norvasc or cozaar  Needs to increase his HDL   Stays tired in general  Gets about 6 hours of sleep per night  Is taking bp med at night -that works out better   Patient Active Problem List   Diagnosis Date Noted  . Cellulitis of leg, left 03/06/2014  . Routine general medical examination at a health care facility 05/27/2012  . ESSENTIAL HYPERTENSION 01/15/2010   Past Medical History  Diagnosis Date  . Unspecified essential hypertension   . Neoplasm of uncertain behavior of skin    No past surgical history on file. History  Substance Use Topics  . Smoking status: Never Smoker   . Smokeless tobacco: Not on file  . Alcohol Use: Yes     Comment: rarely   Family History  Problem Relation Age of Onset  . West disease Father     has been on dialysis for years  . Diabetes Father     ? -unsure  . Leukemia Mother   . Diabetes Mother   . Arthritis Maternal Grandfather   . Arthritis Maternal Grandmother   . Arthritis Paternal Grandfather   . Arthritis Paternal Grandmother    Allergies  Allergen Reactions  . Amoxicillin     REACTION: rash  .  Benazepril Cough  . Keflex [Cephalexin]     Rash   . Penicillins Rash   Current Outpatient Prescriptions on File Prior to Visit  Medication Sig Dispense Refill  . amLODipine (NORVASC) 5 MG tablet take 1 tablet by mouth once daily  30 tablet  0  . losartan (COZAAR) 50 MG tablet take 1 tablet by mouth once daily  30 tablet  0  . Multiple Vitamin (MULTIVITAMIN) tablet Take 1 tablet by mouth daily.         No current facility-administered medications on file prior to visit.    Review of Systems Review of Systems  Constitutional: Negative for fever, appetite change, fatigue and unexpected weight change.  Eyes: Negative for pain and visual disturbance.  Respiratory: Negative for cough and shortness of breath.   Cardiovascular: Negative for cp or palpitations    Gastrointestinal: Negative for nausea, diarrhea and constipation.  Genitourinary: Negative for urgency and frequency.  Skin: Negative for pallor or rash   Neurological: Negative for weakness, light-headedness, numbness and headaches.  Hematological: Negative for adenopathy. Does not bruise/bleed easily.  Psychiatric/Behavioral: Negative for dysphoric mood. The patient is not nervous/anxious.  Objective:   Physical Exam  Constitutional: He appears well-developed and well-nourished. No distress.  HENT:  Head: Normocephalic and atraumatic.  Right Ear: External ear normal.  Left Ear: External ear normal.  Nose: Nose normal.  Mouth/Throat: Oropharynx is clear and moist.  Eyes: Conjunctivae and EOM are normal. Pupils are equal, round, and reactive to light. Right eye exhibits no discharge. Left eye exhibits no discharge. No scleral icterus.  Neck: Normal range of motion. Neck supple. No JVD present. Carotid bruit is not present. No thyromegaly present.  Cardiovascular: Normal rate, regular rhythm, normal heart sounds and intact distal pulses.  Exam reveals no gallop.   Pulmonary/Chest: Effort normal and breath sounds  normal. No respiratory distress. He has no wheezes. He exhibits no tenderness.  Abdominal: Soft. Bowel sounds are normal. He exhibits no distension, no abdominal bruit and no mass. There is no tenderness.  Musculoskeletal: He exhibits no edema and no tenderness.  Lymphadenopathy:    He has no cervical adenopathy.  Neurological: He is alert. He has normal reflexes. No cranial nerve deficit. He exhibits normal muscle tone. Coordination normal.  Skin: Skin is warm and dry. No rash noted. No erythema. No pallor.  Ruddy complexion Solar lentigos diffusely   Flaking of skin on soles of feet consistent with tinea pedis Also some maceration of skin between 4th anf 5th toes bilaterally  Psychiatric: He has a normal mood and affect.          Assessment & Plan:   Problem List Items Addressed This Visit     Cardiovascular and Mediastinum   ESSENTIAL HYPERTENSION      bp in fair control at this time  BP Readings from Last 1 Encounters:  06/08/14 128/84   No changes needed Disc lifstyle change with low sodium diet and exercise  meds refilled  Lab today    Relevant Medications      losartan (COZAAR) tablet      amLODIpine (NORVASC) tablet     Musculoskeletal and Integument   Tinea pedis     Adv use of antifungal powder otc to avoid symptoms or nail problems       Other   Routine general medical examination at a health care facility - Primary     Reviewed health habits including diet and exercise and skin cancer prevention Reviewed appropriate screening tests for age  Also reviewed health mt list, fam hx and immunization status , as well as social and family history   See HPI Lab today Enc further weight loss      Relevant Orders      CBC with Differential (Completed)      Comprehensive metabolic panel (Completed)      TSH (Completed)      Lipid panel (Completed)

## 2014-06-08 NOTE — Assessment & Plan Note (Signed)
Adv use of antifungal powder otc to avoid symptoms or nail problems

## 2014-06-08 NOTE — Progress Notes (Signed)
Pre visit review using our clinic review tool, if applicable. No additional management support is needed unless otherwise documented below in the visit note. 

## 2014-06-08 NOTE — Patient Instructions (Signed)
No change in medicines Work on a healthy diet and exercise  You have athlete's foot - get an antifungal foot powder (like Lamasil ) - and use it every day  Labs today

## 2014-06-08 NOTE — Assessment & Plan Note (Signed)
Reviewed health habits including diet and exercise and skin cancer prevention Reviewed appropriate screening tests for age  Also reviewed health mt list, fam hx and immunization status , as well as social and family history   See HPI Lab today Enc further weight loss

## 2014-06-08 NOTE — Assessment & Plan Note (Signed)
bp in fair control at this time  BP Readings from Last 1 Encounters:  06/08/14 128/84   No changes needed Disc lifstyle change with low sodium diet and exercise  meds refilled  Lab today

## 2014-06-11 ENCOUNTER — Telehealth: Payer: Self-pay | Admitting: Family Medicine

## 2014-06-11 NOTE — Telephone Encounter (Signed)
Relevant patient education assigned to patient using Emmi. ° °

## 2014-11-27 ENCOUNTER — Encounter: Payer: Self-pay | Admitting: Family Medicine

## 2014-11-28 ENCOUNTER — Ambulatory Visit (INDEPENDENT_AMBULATORY_CARE_PROVIDER_SITE_OTHER): Payer: 59 | Admitting: Family Medicine

## 2014-11-28 ENCOUNTER — Encounter: Payer: Self-pay | Admitting: Family Medicine

## 2014-11-28 VITALS — BP 128/86 | HR 76 | Temp 97.6°F | Ht 70.75 in | Wt 261.4 lb

## 2014-11-28 DIAGNOSIS — J069 Acute upper respiratory infection, unspecified: Secondary | ICD-10-CM | POA: Insufficient documentation

## 2014-11-28 NOTE — Progress Notes (Signed)
Pre visit review using our clinic review tool, if applicable. No additional management support is needed unless otherwise documented below in the visit note. 

## 2014-11-28 NOTE — Patient Instructions (Signed)
Drink lots of fluids and rest  You have a cold (viral) that has to run its course  Over the counter medicines are helpful - I like mucinex for congestion and zyrtec for runny nose and eyes  If you develop increased facial pain or fever  -or improve and then get worse -please let me know  (that could be a sign of a sinus infection)   Keep washing hands to prevent giving this to anyone

## 2014-11-28 NOTE — Progress Notes (Signed)
Subjective:    Patient ID: Raymond West, male    DOB: 1975/11/27, 39 y.o.   MRN: [id number]  HPI Here with uri symptoms since Sunday   Pressure in face and pain  Watery eyes  Sneezing - with nasal congestion / yellow discharge  Throat is ok  Some hoarseness  Ears are ok   Cough- very little - from drip   No fever - but achey last night   otc - is taking mucinex and sudafed Sometimes nyquil and dayquil   Patient Active Problem List   Diagnosis Date Noted  . Tinea pedis 06/08/2014  . Cellulitis of leg, left 03/06/2014  . Routine general medical examination at a health care facility 05/27/2012  . ESSENTIAL HYPERTENSION 01/15/2010   Past Medical History  Diagnosis Date  . Unspecified essential hypertension   . Neoplasm of uncertain behavior of skin    No past surgical history on file. History  Substance Use Topics  . Smoking status: Never Smoker   . Smokeless tobacco: Not on file  . Alcohol Use: Yes     Comment: rarely   Family History  Problem Relation Age of Onset  . Kidney disease Father     has been on dialysis for years  . Diabetes Father     ? -unsure  . Leukemia Mother   . Diabetes Mother   . Arthritis Maternal Grandfather   . Arthritis Maternal Grandmother   . Arthritis Paternal Grandfather   . Arthritis Paternal Grandmother    Allergies  Allergen Reactions  . Amoxicillin     REACTION: rash  . Benazepril Cough  . Keflex [Cephalexin]     Rash   . Penicillins Rash   Current Outpatient Prescriptions on File Prior to Visit  Medication Sig Dispense Refill  . amLODipine (NORVASC) 5 MG tablet take 1 tablet by mouth once daily 30 tablet 11  . losartan (COZAAR) 50 MG tablet take 1 tablet by mouth once daily 30 tablet 11  . Multiple Vitamin (MULTIVITAMIN) tablet Take 1 tablet by mouth daily.       No current facility-administered medications on file prior to visit.      Review of Systems Review of Systems  Constitutional: Negative for fever,  appetite change,  and unexpected weight change.  ENT pos for cong and rhinorrhea and sneezing  Eyes: Negative for pain and visual disturbance.  Respiratory: Negative for wheeze  and shortness of breath.   Cardiovascular: Negative for cp or palpitations    Gastrointestinal: Negative for nausea, diarrhea and constipation.  Genitourinary: Negative for urgency and frequency.  Skin: Negative for pallor or rash   Neurological: Negative for weakness, light-headedness, numbness and headaches.  Hematological: Negative for adenopathy. Does not bruise/bleed easily.  Psychiatric/Behavioral: Negative for dysphoric mood. The patient is not nervous/anxious.         Objective:   Physical Exam  Constitutional: He appears well-developed and well-nourished. No distress.  obese and well appearing   HENT:  Head: Normocephalic and atraumatic.  Right Ear: External ear normal.  Left Ear: External ear normal.  Mouth/Throat: Oropharynx is clear and moist. No oropharyngeal exudate.  Nares are injected and congested  Clear rhinorrhea No sinus tenderness Throat clear  Eyes: Conjunctivae and EOM are normal. Pupils are equal, round, and reactive to light. Right eye exhibits no discharge. Left eye exhibits no discharge.  Neck: Normal range of motion. Neck supple.  Cardiovascular: Normal rate, regular rhythm and normal heart sounds.   Pulmonary/Chest: Effort normal  and breath sounds normal. No respiratory distress. He has no wheezes. He has no rales.  Lymphadenopathy:    He has no cervical adenopathy.  Neurological: He is alert.  Skin: Skin is warm and dry. No rash noted.  Psychiatric: He has a normal mood and affect.          Assessment & Plan:   Problem List Items Addressed This Visit      Respiratory   Viral URI - Primary    Disc symptomatic care - see instructions on AVS  Disc red flag symptoms for sinus infection  Sill update

## 2014-11-30 NOTE — Assessment & Plan Note (Signed)
Disc symptomatic care - see instructions on AVS  Disc red flag symptoms for sinus infection  Sill update

## 2015-06-21 ENCOUNTER — Encounter: Payer: 59 | Admitting: Family Medicine

## 2015-06-25 ENCOUNTER — Other Ambulatory Visit: Payer: Self-pay | Admitting: Family Medicine

## 2015-06-25 MED ORDER — AMLODIPINE BESYLATE 5 MG PO TABS: ORAL_TABLET | ORAL | Status: DC

## 2015-06-25 NOTE — Addendum Note (Signed)
Addended by: Tammi Sou on: 06/25/2015 12:50 PM   Modules accepted: Orders

## 2015-06-28 ENCOUNTER — Ambulatory Visit (INDEPENDENT_AMBULATORY_CARE_PROVIDER_SITE_OTHER): Payer: 59 | Admitting: Family Medicine

## 2015-06-28 ENCOUNTER — Encounter: Payer: Self-pay | Admitting: Family Medicine

## 2015-06-28 VITALS — BP 122/66 | HR 58 | Temp 97.7°F | Ht 70.25 in | Wt 247.5 lb

## 2015-06-28 DIAGNOSIS — Z0000 Encounter for general adult medical examination without abnormal findings: Secondary | ICD-10-CM

## 2015-06-28 DIAGNOSIS — I10 Essential (primary) hypertension: Secondary | ICD-10-CM

## 2015-06-28 DIAGNOSIS — Z114 Encounter for screening for human immunodeficiency virus [HIV]: Secondary | ICD-10-CM

## 2015-06-28 LAB — LIPID PANEL
CHOL/HDL RATIO: 5.1 ratio — AB (ref <=5.0)
CHOLESTEROL: 167 mg/dL (ref 125–200)
HDL: 33 mg/dL — AB (ref >=40)
LDL Cholesterol: 95 mg/dL (ref <130)
TRIGLYCERIDES: 196 mg/dL — AB (ref <150)
VLDL: 39 mg/dL — ABNORMAL HIGH (ref <30)

## 2015-06-28 LAB — COMPREHENSIVE METABOLIC PANEL
ALBUMIN: 4.4 g/dL (ref 3.6–5.1)
ALT: 27 U/L (ref 9–46)
AST: 25 U/L (ref 10–40)
Alkaline Phosphatase: 56 U/L (ref 40–115)
BUN: 15 mg/dL (ref 7–25)
CALCIUM: 9.1 mg/dL (ref 8.6–10.3)
CHLORIDE: 104 mmol/L (ref 98–110)
CO2: 24 mmol/L (ref 20–31)
CREATININE: 0.88 mg/dL (ref 0.60–1.35)
Glucose, Bld: 91 mg/dL (ref 65–99)
POTASSIUM: 4.1 mmol/L (ref 3.5–5.3)
SODIUM: 140 mmol/L (ref 135–146)
Total Bilirubin: 0.5 mg/dL (ref 0.2–1.2)
Total Protein: 6.9 g/dL (ref 6.1–8.1)

## 2015-06-28 LAB — CBC WITH DIFFERENTIAL/PLATELET
BASOS PCT: 0 % (ref 0–1)
Basophils Absolute: 0 10*3/uL (ref 0.0–0.1)
Eosinophils Absolute: 0.2 10*3/uL (ref 0.0–0.7)
Eosinophils Relative: 2 % (ref 0–5)
HCT: 42.5 % (ref 39.0–52.0)
HEMOGLOBIN: 14.6 g/dL (ref 13.0–17.0)
LYMPHS ABS: 3.5 10*3/uL (ref 0.7–4.0)
Lymphocytes Relative: 38 % (ref 12–46)
MCH: 29.6 pg (ref 26.0–34.0)
MCHC: 34.4 g/dL (ref 30.0–36.0)
MCV: 86 fL (ref 78.0–100.0)
MONO ABS: 0.6 10*3/uL (ref 0.1–1.0)
MPV: 10.1 fL (ref 8.6–12.4)
Monocytes Relative: 7 % (ref 3–12)
Neutro Abs: 4.9 10*3/uL (ref 1.7–7.7)
Neutrophils Relative %: 53 % (ref 43–77)
Platelets: 219 10*3/uL (ref 150–400)
RBC: 4.94 MIL/uL (ref 4.22–5.81)
RDW: 13.4 % (ref 11.5–15.5)
WBC: 9.2 10*3/uL (ref 4.0–10.5)

## 2015-06-28 LAB — HIV ANTIBODY (ROUTINE TESTING W REFLEX): HIV 1&2 Ab, 4th Generation: NONREACTIVE

## 2015-06-28 LAB — TSH: TSH: 1.656 u[IU]/mL (ref 0.350–4.500)

## 2015-06-28 MED ORDER — AMLODIPINE BESYLATE 5 MG PO TABS: ORAL_TABLET | ORAL | Status: DC

## 2015-06-28 MED ORDER — LOSARTAN POTASSIUM 50 MG PO TABS: 50.0000 mg | ORAL_TABLET | Freq: Every day | ORAL | Status: DC

## 2015-06-28 NOTE — Patient Instructions (Addendum)
Labs today for screening /wellness  Don't forget to get a flu shot in the fall  Good job with weight loss - keep up the good work  Make sure to drink lots of fluids in the heat

## 2015-06-28 NOTE — Progress Notes (Signed)
Pre visit review using our clinic review tool, if applicable. No additional management support is needed unless otherwise documented below in the visit note. 

## 2015-06-28 NOTE — Progress Notes (Signed)
Subjective:    Patient ID: Raymond West, male    DOB: 07-04-1976, 39 y.o.   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  Does make an effort to drink a lot of water   HIV screen - does want screening   He gets a lot of tick bites  No fever / rash or other symptoms    Flu shot - got it last fall - will get one this fall also   Td 9/11   Needs lab today - will do today  Ate bacon and egg cheese biscuit for bkfast 10 am   For cholesterol - usually does not eat like that - usually grilled chicken / avoids fast food / not a lot of red meat   bp is stable today -well controlled  No cp or palpitations or headaches or edema  No side effects to medicines  BP Readings from Last 3 Encounters:  06/28/15 122/66  11/28/14 128/86  06/08/14 128/84       Patient Active Problem List   Diagnosis Date Noted  . Tinea pedis 06/08/2014  . Routine general medical examination at a health care facility 05/27/2012  . Essential hypertension 01/15/2010   Past Medical History  Diagnosis Date  . Unspecified essential hypertension   . Neoplasm of uncertain behavior of skin    No past surgical history on file. Social History  Substance Use Topics  . Smoking status: Never Smoker   . Smokeless tobacco: None  . Alcohol Use: 0.0 oz/week    0 Standard drinks or equivalent per week     Comment: rarely   Family History  Problem Relation Age of Onset  . Kidney disease Father     has been on dialysis for years  . Diabetes Father     ? -unsure  . Leukemia Mother   . Diabetes Mother   . Arthritis Maternal Grandfather   . Arthritis Maternal Grandmother   . Arthritis Paternal Grandfather   . Arthritis Paternal Grandmother    Allergies  Allergen Reactions   . Amoxicillin     REACTION: rash  . Benazepril Cough  . Keflex [Cephalexin]     Rash   . Penicillins Rash   Current Outpatient Prescriptions on File Prior to Visit  Medication Sig Dispense Refill  . amLODipine (NORVASC) 5 MG tablet take 1 tablet by mouth once daily 30 tablet 0  . losartan (COZAAR) 50 MG tablet take 1 tablet by mouth once daily 30 tablet 0  . Multiple Vitamin (MULTIVITAMIN) tablet Take 1 tablet by mouth daily.       No current facility-administered medications on file prior to visit.     Review of Systems Review of Systems  Constitutional: Negative for fever, appetite change, fatigue and unexpected weight change.  Eyes: Negative for pain and visual disturbance.  Respiratory: Negative for cough and shortness of breath.   Cardiovascular: Negative for cp or palpitations    Gastrointestinal: Negative for nausea, diarrhea and constipation.  Genitourinary: Negative for urgency and frequency.  Skin: Negative for pallor or rash   Neurological: Negative for weakness, light-headedness, numbness and headaches.  Hematological: Negative for adenopathy. Does not bruise/bleed easily.  Psychiatric/Behavioral: Negative  for dysphoric mood. The patient is not nervous/anxious.         Objective:   Physical Exam  Constitutional: He appears well-developed and well-nourished. No distress.  obese and well appearing   HENT:  Head: Normocephalic and atraumatic.  Right Ear: External ear normal.  Left Ear: External ear normal.  Nose: Nose normal.  Mouth/Throat: Oropharynx is clear and moist.  Eyes: Conjunctivae and EOM are normal. Pupils are equal, round, and reactive to light. Right eye exhibits no discharge. Left eye exhibits no discharge. No scleral icterus.  Neck: Normal range of motion. Neck supple. No JVD present. Carotid bruit is not present. No thyromegaly present.  Cardiovascular: Normal rate, regular rhythm, normal heart sounds and intact distal pulses.  Exam reveals no  gallop.   Pulmonary/Chest: Effort normal and breath sounds normal. No respiratory distress. He has no wheezes. He exhibits no tenderness.  Abdominal: Soft. Bowel sounds are normal. He exhibits no distension, no abdominal bruit and no mass. There is no tenderness.  Musculoskeletal: He exhibits no edema or tenderness.  Lymphadenopathy:    He has no cervical adenopathy.  Neurological: He is alert. He has normal reflexes. No cranial nerve deficit. He exhibits normal muscle tone. Coordination normal.  Skin: Skin is warm and dry. No rash noted. No erythema. No pallor.  Ruddy complexion with some sunburn noted  Some lentigo  Psychiatric: He has a normal mood and affect.          Assessment & Plan:   Problem List Items Addressed This Visit    Essential hypertension    bp in fair control at this time  BP Readings from Last 1 Encounters:  06/28/15 122/66   No changes needed Disc lifstyle change with low sodium diet and exercise  Lab today      Relevant Medications   losartan (COZAAR) 50 MG tablet   amLODipine (NORVASC) 5 MG tablet   Routine general medical examination at a health care facility - Primary    Reviewed health habits including diet and exercise and skin cancer prevention Reviewed appropriate screening tests for age  Also reviewed health mt list, fam hx and immunization status , as well as social and family history   See HPI Labs for wellness and screening (HIV) today  Enc further wt loss  Plans to get a flu shot in the fall       Relevant Orders   CBC with Differential/Platelet (Completed)   Comprehensive metabolic panel (Completed)   TSH (Completed)   Lipid panel (Completed)   Screening for HIV (human immunodeficiency virus)    Screening with labs today  Pt does not think he is high risk       Relevant Orders   HIV antibody (with reflex) (Completed)

## 2015-06-30 NOTE — Assessment & Plan Note (Signed)
bp in fair control at this time  BP Readings from Last 1 Encounters:  06/28/15 122/66   No changes needed Disc lifstyle change with low sodium diet and exercise  Lab today

## 2015-06-30 NOTE — Assessment & Plan Note (Signed)
Reviewed health habits including diet and exercise and skin cancer prevention Reviewed appropriate screening tests for age  Also reviewed health mt list, fam hx and immunization status , as well as social and family history   See HPI Labs for wellness and screening (HIV) today  Enc further wt loss  Plans to get a flu shot in the fall

## 2015-06-30 NOTE — Assessment & Plan Note (Signed)
Screening with labs today  Pt does not think he is high risk

## 2016-06-26 ENCOUNTER — Ambulatory Visit (INDEPENDENT_AMBULATORY_CARE_PROVIDER_SITE_OTHER): Payer: BLUE CROSS/BLUE SHIELD | Admitting: Family Medicine

## 2016-06-26 ENCOUNTER — Encounter: Payer: Self-pay | Admitting: Family Medicine

## 2016-06-26 VITALS — BP 114/66 | HR 73 | Temp 98.1°F | Ht 70.5 in | Wt 250.2 lb

## 2016-06-26 DIAGNOSIS — E6609 Other obesity due to excess calories: Secondary | ICD-10-CM | POA: Insufficient documentation

## 2016-06-26 DIAGNOSIS — E669 Obesity, unspecified: Secondary | ICD-10-CM | POA: Diagnosis not present

## 2016-06-26 DIAGNOSIS — Z6835 Body mass index (BMI) 35.0-35.9, adult: Secondary | ICD-10-CM

## 2016-06-26 DIAGNOSIS — I10 Essential (primary) hypertension: Secondary | ICD-10-CM | POA: Diagnosis not present

## 2016-06-26 DIAGNOSIS — Z0000 Encounter for general adult medical examination without abnormal findings: Secondary | ICD-10-CM

## 2016-06-26 DIAGNOSIS — Z6837 Body mass index (BMI) 37.0-37.9, adult: Secondary | ICD-10-CM | POA: Insufficient documentation

## 2016-06-26 LAB — CBC WITH DIFFERENTIAL/PLATELET
BASOS ABS: 0 10*3/uL (ref 0.0–0.1)
Basophils Relative: 0.4 % (ref 0.0–3.0)
Eosinophils Absolute: 0.1 10*3/uL (ref 0.0–0.7)
Eosinophils Relative: 1.3 % (ref 0.0–5.0)
HEMATOCRIT: 44.3 % (ref 39.0–52.0)
HEMOGLOBIN: 14.9 g/dL (ref 13.0–17.0)
LYMPHS PCT: 34.6 % (ref 12.0–46.0)
Lymphs Abs: 3.1 10*3/uL (ref 0.7–4.0)
MCHC: 33.6 g/dL (ref 30.0–36.0)
MCV: 84.9 fl (ref 78.0–100.0)
MONOS PCT: 6.5 % (ref 3.0–12.0)
Monocytes Absolute: 0.6 10*3/uL (ref 0.1–1.0)
Neutro Abs: 5.1 10*3/uL (ref 1.4–7.7)
Neutrophils Relative %: 57.2 % (ref 43.0–77.0)
Platelets: 213 10*3/uL (ref 150.0–400.0)
RBC: 5.22 Mil/uL (ref 4.22–5.81)
RDW: 13.4 % (ref 11.5–15.5)
WBC: 8.9 10*3/uL (ref 4.0–10.5)

## 2016-06-26 LAB — LIPID PANEL
CHOLESTEROL: 173 mg/dL (ref 0–200)
HDL: 40.2 mg/dL (ref >39.00)
LDL Cholesterol: 113 mg/dL — ABNORMAL HIGH (ref 0–99)
NonHDL: 132.86
TRIGLYCERIDES: 97 mg/dL (ref 0.0–149.0)
Total CHOL/HDL Ratio: 4
VLDL: 19.4 mg/dL (ref 0.0–40.0)

## 2016-06-26 LAB — COMPREHENSIVE METABOLIC PANEL
ALBUMIN: 4.6 g/dL (ref 3.5–5.2)
ALT: 36 U/L (ref 0–53)
AST: 25 U/L (ref 0–37)
Alkaline Phosphatase: 51 U/L (ref 39–117)
BILIRUBIN TOTAL: 0.7 mg/dL (ref 0.2–1.2)
BUN: 20 mg/dL (ref 6–23)
CALCIUM: 9.3 mg/dL (ref 8.4–10.5)
CO2: 29 mEq/L (ref 19–32)
Chloride: 107 mEq/L (ref 96–112)
Creatinine, Ser: 0.9 mg/dL (ref 0.40–1.50)
GFR: 99.09 mL/min (ref >60.00)
Glucose, Bld: 95 mg/dL (ref 70–99)
Potassium: 4.5 mEq/L (ref 3.5–5.1)
Sodium: 143 mEq/L (ref 135–145)
TOTAL PROTEIN: 7.4 g/dL (ref 6.0–8.3)

## 2016-06-26 LAB — TSH: TSH: 1.78 u[IU]/mL (ref 0.35–4.50)

## 2016-06-26 MED ORDER — AMLODIPINE BESYLATE 5 MG PO TABS: ORAL_TABLET | ORAL | 11 refills | Status: DC

## 2016-06-26 MED ORDER — LOSARTAN POTASSIUM 50 MG PO TABS: 50.0000 mg | ORAL_TABLET | Freq: Every day | ORAL | 11 refills | Status: DC

## 2016-06-26 NOTE — Progress Notes (Signed)
Pre visit review using our clinic review tool, if applicable. No additional management support is needed unless otherwise documented below in the visit note. 

## 2016-06-26 NOTE — Progress Notes (Signed)
Subjective:    Patient ID: Raymond West, male    DOB: Jan 05, 1976, 40 y.o.   MRN: [id number]  HPI Here for health maintenance exam and to review chronic medical problems    Working and doing well  Some allergy/sinus issues a week ago - improved now   Daughter is doing well  Starting to walk ! Excited about that   Wt Readings from Last 3 Encounters:  06/26/16 250 lb 4 oz (113.5 kg)  06/28/15 247 lb 8 oz (112.3 kg)  11/28/14 261 lb 6.4 oz (118.6 kg)  BMI is 35.4 -obese range Up a few lb - but kept most of weight off 1/16  Ate a lot last night  Has given up fast food  Needs to start packing a lunch   Exercise - very physical job  - also plays with daughter   Flu shots- needs to get one this year   Td 9/11  HIV screen was done 8/16-neg   Cholesterol Lab Results  Component Value Date   CHOL 167 06/28/2015   CHOL 164 06/08/2014   CHOL 169 05/29/2013   Lab Results  Component Value Date   HDL 33 (L) 06/28/2015   HDL 33.90 (L) 06/08/2014   HDL 34.40 (L) 05/29/2013   Lab Results  Component Value Date   LDLCALC 95 06/28/2015   LDLCALC 104 (H) 06/08/2014   LDLCALC 90 05/27/2012   Lab Results  Component Value Date   TRIG 196 (H) 06/28/2015   TRIG 132.0 06/08/2014   TRIG 207.0 (H) 05/29/2013   Lab Results  Component Value Date   CHOLHDL 5.1 (H) 06/28/2015   CHOLHDL 5 06/08/2014   CHOLHDL 5 05/29/2013   Lab Results  Component Value Date   LDLDIRECT 118.5 05/29/2013  triglycerides are up - loves bread  HDL is low - time for re check    bp is stable today  No cp or palpitations or headaches or edema  No side effects to medicines  BP Readings from Last 3 Encounters:  06/26/16 114/66  06/28/15 122/66  11/28/14 128/86     Family hx - mother had leukemia/ died  Father- DM and renal dz  No new family history   No prostate cancer in family   No prostate symptoms  No nocturia  No frequency or flow issues   Patient Active Problem List   Diagnosis Date  Noted  . Obesity 06/26/2016  . Screening for HIV (human immunodeficiency virus) 06/28/2015  . Tinea pedis 06/08/2014  . Routine general medical examination at a health care facility 05/27/2012  . Essential hypertension 01/15/2010   Past Medical History:  Diagnosis Date  . Neoplasm of uncertain behavior of skin   . Unspecified essential hypertension    No past surgical history on file. Social History  Substance Use Topics  . Smoking status: Never Smoker  . Smokeless tobacco: Not on file  . Alcohol use 0.0 oz/week     Comment: rarely   Family History  Problem Relation Age of Onset  . Kidney disease Father     has been on dialysis for years  . Diabetes Father     ? -unsure  . Leukemia Mother   . Diabetes Mother   . Arthritis Maternal Grandfather   . Arthritis Maternal Grandmother   . Arthritis Paternal Grandfather   . Arthritis Paternal Grandmother    Allergies  Allergen Reactions  . Amoxicillin     REACTION: rash  . Benazepril Cough  . Keflex [  Cephalexin]     Rash   . Penicillins Rash   Current Outpatient Prescriptions on File Prior to Visit  Medication Sig Dispense Refill  . amLODipine (NORVASC) 5 MG tablet take 1 tablet by mouth once daily 30 tablet 11  . losartan (COZAAR) 50 MG tablet Take 1 tablet (50 mg total) by mouth daily. 30 tablet 11  . Multiple Vitamin (MULTIVITAMIN) tablet Take 1 tablet by mouth daily.       No current facility-administered medications on file prior to visit.      Review of Systems    Review of Systems  Constitutional: Negative for fever, appetite change, fatigue and unexpected weight change.  Eyes: Negative for pain and visual disturbance.  Respiratory: Negative for cough and shortness of breath.   Cardiovascular: Negative for cp or palpitations    Gastrointestinal: Negative for nausea, diarrhea and constipation.  Genitourinary: Negative for urgency and frequency.  Skin: Negative for pallor or rash   Neurological: Negative for  weakness, light-headedness, numbness and headaches.  Hematological: Negative for adenopathy. Does not bruise/bleed easily.  Psychiatric/Behavioral: Negative for dysphoric mood. The patient is not nervous/anxious.      Objective:   Physical Exam  Constitutional: He appears well-developed and well-nourished. No distress.  obese and well appearing   HENT:  Head: Normocephalic and atraumatic.  Right Ear: External ear normal.  Left Ear: External ear normal.  Nose: Nose normal.  Mouth/Throat: Oropharynx is clear and moist.  Eyes: Conjunctivae and EOM are normal. Pupils are equal, round, and reactive to light. Right eye exhibits no discharge. Left eye exhibits no discharge. No scleral icterus.  Neck: Normal range of motion. Neck supple. No JVD present. Carotid bruit is not present. No thyromegaly present.  Cardiovascular: Normal rate, regular rhythm, normal heart sounds and intact distal pulses.  Exam reveals no gallop.   Pulmonary/Chest: Effort normal and breath sounds normal. No respiratory distress. He has no wheezes. He exhibits no tenderness.  Abdominal: Soft. Bowel sounds are normal. He exhibits no distension, no abdominal bruit and no mass. There is no tenderness.  Musculoskeletal: He exhibits no edema or tenderness.  Lymphadenopathy:    He has no cervical adenopathy.  Neurological: He is alert. He has normal reflexes. No cranial nerve deficit. He exhibits normal muscle tone. Coordination normal.  Skin: Skin is warm and dry. No rash noted. No erythema. No pallor.  Psychiatric: He has a normal mood and affect.          Assessment & Plan:   Problem List Items Addressed This Visit      Cardiovascular and Mediastinum   Essential hypertension    bp in fair control at this time  BP Readings from Last 1 Encounters:  06/26/16 114/66   No changes needed Disc lifstyle change with low sodium diet and exercise  Wt loss enc  Labs today      Relevant Medications   amLODipine  (NORVASC) 5 MG tablet   losartan (COZAAR) 50 MG tablet     Other   Routine general medical examination at a health care facility - Primary    Reviewed health habits including diet and exercise and skin cancer prevention Reviewed appropriate screening tests for age  Also reviewed health mt list, fam hx and immunization status , as well as social and family history   See hPI Enc exercise and wt loss  Labs today        Relevant Orders   CBC with Differential/Platelet (Completed)   Comprehensive metabolic  panel (Completed)   TSH (Completed)   Lipid panel (Completed)   Obesity    Discussed how this problem influences overall health and the risks it imposes  Reviewed plan for weight loss with lower calorie diet (via better food choices and also portion control or program like weight watchers) and exercise building up to or more than 30 minutes 5 days per week including some aerobic activity          Other Visit Diagnoses   None.

## 2016-06-26 NOTE — Patient Instructions (Addendum)
Don't forget to get a flu shot in the fall  For low HDl (good cholesterol)- exercise helps (fish oil supplements and fish help) Triglycerides are up - cut back on bread and sweets  Labs today - we will see how they look

## 2016-06-28 NOTE — Assessment & Plan Note (Signed)
bp in fair control at this time  BP Readings from Last 1 Encounters:  06/26/16 114/66   No changes needed Disc lifstyle change with low sodium diet and exercise  Wt loss enc  Labs today

## 2016-06-28 NOTE — Assessment & Plan Note (Signed)
Reviewed health habits including diet and exercise and skin cancer prevention Reviewed appropriate screening tests for age  Also reviewed health mt list, fam hx and immunization status , as well as social and family history   See hPI Enc exercise and wt loss  Labs today

## 2016-06-28 NOTE — Assessment & Plan Note (Signed)
Discussed how this problem influences overall health and the risks it imposes  Reviewed plan for weight loss with lower calorie diet (via better food choices and also portion control or program like weight watchers) and exercise building up to or more than 30 minutes 5 days per week including some aerobic activity    

## 2016-08-07 DIAGNOSIS — Z23 Encounter for immunization: Secondary | ICD-10-CM | POA: Diagnosis not present

## 2016-12-01 ENCOUNTER — Telehealth: Payer: Self-pay | Admitting: Family Medicine

## 2016-12-01 MED ORDER — OSELTAMIVIR PHOSPHATE 75 MG PO CAPS: 75.0000 mg | ORAL_CAPSULE | Freq: Every day | ORAL | 0 refills | Status: DC

## 2016-12-01 NOTE — Telephone Encounter (Signed)
Daughter with confirmed flu.  Wife asks for tamiflu prophylaxis for pt. eRx sent.

## 2016-12-28 DIAGNOSIS — Z1283 Encounter for screening for malignant neoplasm of skin: Secondary | ICD-10-CM | POA: Diagnosis not present

## 2016-12-28 DIAGNOSIS — L578 Other skin changes due to chronic exposure to nonionizing radiation: Secondary | ICD-10-CM | POA: Diagnosis not present

## 2016-12-28 DIAGNOSIS — D229 Melanocytic nevi, unspecified: Secondary | ICD-10-CM | POA: Diagnosis not present

## 2016-12-28 DIAGNOSIS — D485 Neoplasm of uncertain behavior of skin: Secondary | ICD-10-CM | POA: Diagnosis not present

## 2017-07-07 ENCOUNTER — Ambulatory Visit (INDEPENDENT_AMBULATORY_CARE_PROVIDER_SITE_OTHER): Payer: BLUE CROSS/BLUE SHIELD | Admitting: Family Medicine

## 2017-07-07 ENCOUNTER — Encounter: Payer: Self-pay | Admitting: Family Medicine

## 2017-07-07 VITALS — BP 110/66 | HR 63 | Temp 97.8°F | Ht 70.25 in | Wt 250.5 lb

## 2017-07-07 DIAGNOSIS — I10 Essential (primary) hypertension: Secondary | ICD-10-CM | POA: Diagnosis not present

## 2017-07-07 DIAGNOSIS — Z0000 Encounter for general adult medical examination without abnormal findings: Secondary | ICD-10-CM

## 2017-07-07 DIAGNOSIS — Z6835 Body mass index (BMI) 35.0-35.9, adult: Secondary | ICD-10-CM | POA: Diagnosis not present

## 2017-07-07 LAB — COMPREHENSIVE METABOLIC PANEL
ALT: 32 U/L (ref 0–53)
AST: 25 U/L (ref 0–37)
Albumin: 4.7 g/dL (ref 3.5–5.2)
Alkaline Phosphatase: 54 U/L (ref 39–117)
BUN: 19 mg/dL (ref 6–23)
CO2: 30 mEq/L (ref 19–32)
Calcium: 9.8 mg/dL (ref 8.4–10.5)
Chloride: 104 mEq/L (ref 96–112)
Creatinine, Ser: 0.86 mg/dL (ref 0.40–1.50)
GFR: 103.9 mL/min (ref >60.00)
GLUCOSE: 97 mg/dL (ref 70–99)
Potassium: 4.1 mEq/L (ref 3.5–5.1)
SODIUM: 139 meq/L (ref 135–145)
Total Bilirubin: 0.8 mg/dL (ref 0.2–1.2)
Total Protein: 7.4 g/dL (ref 6.0–8.3)

## 2017-07-07 LAB — CBC WITH DIFFERENTIAL/PLATELET
BASOS ABS: 0 10*3/uL (ref 0.0–0.1)
Basophils Relative: 0.4 % (ref 0.0–3.0)
EOS ABS: 0.1 10*3/uL (ref 0.0–0.7)
Eosinophils Relative: 1.2 % (ref 0.0–5.0)
HCT: 44.3 % (ref 39.0–52.0)
Hemoglobin: 14.9 g/dL (ref 13.0–17.0)
LYMPHS PCT: 30.5 % (ref 12.0–46.0)
Lymphs Abs: 2.8 10*3/uL (ref 0.7–4.0)
MCHC: 33.6 g/dL (ref 30.0–36.0)
MCV: 86.3 fl (ref 78.0–100.0)
Monocytes Absolute: 0.6 10*3/uL (ref 0.1–1.0)
Monocytes Relative: 6.4 % (ref 3.0–12.0)
NEUTROS PCT: 61.5 % (ref 43.0–77.0)
Neutro Abs: 5.6 10*3/uL (ref 1.4–7.7)
Platelets: 196 10*3/uL (ref 150.0–400.0)
RBC: 5.13 Mil/uL (ref 4.22–5.81)
RDW: 13.5 % (ref 11.5–15.5)
WBC: 9.1 10*3/uL (ref 4.0–10.5)

## 2017-07-07 LAB — TSH: TSH: 2.05 u[IU]/mL (ref 0.35–4.50)

## 2017-07-07 LAB — LIPID PANEL
Cholesterol: 185 mg/dL (ref 0–200)
HDL: 38.8 mg/dL — ABNORMAL LOW (ref >39.00)
LDL Cholesterol: 120 mg/dL — ABNORMAL HIGH (ref 0–99)
NONHDL: 145.72
Total CHOL/HDL Ratio: 5
Triglycerides: 127 mg/dL (ref 0.0–149.0)
VLDL: 25.4 mg/dL (ref 0.0–40.0)

## 2017-07-07 MED ORDER — LOSARTAN POTASSIUM 50 MG PO TABS: 50.0000 mg | ORAL_TABLET | Freq: Every day | ORAL | 11 refills | Status: DC

## 2017-07-07 MED ORDER — AMLODIPINE BESYLATE 5 MG PO TABS: ORAL_TABLET | ORAL | 11 refills | Status: DC

## 2017-07-07 NOTE — Assessment & Plan Note (Signed)
bp in fair control at this time  BP Readings from Last 1 Encounters:  07/07/17 110/66   No changes needed Disc lifstyle change with low sodium diet and exercise  Labs ordered Enc wt loss

## 2017-07-07 NOTE — Assessment & Plan Note (Signed)
Reviewed health habits including diet and exercise and skin cancer prevention Reviewed appropriate screening tests for age  Also reviewed health mt list, fam hx and immunization status , as well as social and family history   See HPI Labs ordered  bp in good control  Enc flu shot in the fall (he will likely get it at work)

## 2017-07-07 NOTE — Progress Notes (Signed)
Subjective:    Patient ID: Raymond West, male    DOB: August 16, 1976, 41 y.o.   MRN: [id number]  HPI Here for health maintenance exam and to review chronic medical problems    Has a 28 y old - doing well /enjoyed it   Feeling good   Wt Readings from Last 3 Encounters:  07/07/17 250 lb 8 oz (113.6 kg)  06/26/16 250 lb 4 oz (113.5 kg)  06/28/15 247 lb 8 oz (112.3 kg)  diet -- does not eat a lot of junk food or sweets / tries to eat vegetables  Avoids fast food  Exercise -has a physical job  35.69 kg/m  Flu shot - will get in the fall  - Tetanus shot 9/11 Tdap  Neg HIV screen 8/16  Has not had labs   bp is stable today  No cp or palpitations or headaches or edema  No side effects to medicines  BP Readings from Last 3 Encounters:  07/07/17 110/66  06/26/16 114/66  06/28/15 122/66    On losartan and amlodipine   No new family hx   Prostate hx - no prostate cancer in family  No nocturia  No flow problems at all   Patient Active Problem List   Diagnosis Date Noted  . Obesity 06/26/2016  . Screening for HIV (human immunodeficiency virus) 06/28/2015  . Routine general medical examination at a health care facility 05/27/2012  . Essential hypertension 01/15/2010   Past Medical History:  Diagnosis Date  . Neoplasm of uncertain behavior of skin   . Unspecified essential hypertension    No past surgical history on file. Social History  Substance Use Topics  . Smoking status: Never Smoker  . Smokeless tobacco: Never Used  . Alcohol use 0.0 oz/week     Comment: rarely   Family History  Problem Relation Age of Onset  . Kidney disease Father        has been on dialysis for years  . Diabetes Father        ? -unsure  . Leukemia Mother   . Diabetes Mother   . Arthritis Maternal Grandfather   . Arthritis Maternal Grandmother   . Arthritis Paternal Grandfather   . Arthritis Paternal Grandmother    Allergies  Allergen Reactions  . Amoxicillin     REACTION: rash  .  Benazepril Cough  . Keflex [Cephalexin]     Rash   . Penicillins Rash   Current Outpatient Prescriptions on File Prior to Visit  Medication Sig Dispense Refill  . Multiple Vitamin (MULTIVITAMIN) tablet Take 1 tablet by mouth daily.       No current facility-administered medications on file prior to visit.      Review of Systems Review of Systems  Constitutional: Negative for fever, appetite change, fatigue and unexpected weight change.  Eyes: Negative for pain and visual disturbance.  Respiratory: Negative for cough and shortness of breath.   Cardiovascular: Negative for cp or palpitations    Gastrointestinal: Negative for nausea, diarrhea and constipation.  Genitourinary: Negative for urgency and frequency.  Skin: Negative for pallor or rash   Neurological: Negative for weakness, light-headedness, numbness and headaches.  Hematological: Negative for adenopathy. Does not bruise/bleed easily.  Psychiatric/Behavioral: Negative for dysphoric mood. The patient is not nervous/anxious.         Objective:   Physical Exam  Constitutional: He appears well-developed and well-nourished. No distress.  HENT:  Head: Normocephalic and atraumatic.  Right Ear: External ear normal.  Left  Ear: External ear normal.  Nose: Nose normal.  Mouth/Throat: Oropharynx is clear and moist.  Eyes: Pupils are equal, round, and reactive to light. Conjunctivae and EOM are normal. Right eye exhibits no discharge. Left eye exhibits no discharge. No scleral icterus.  Neck: Normal range of motion. Neck supple. No JVD present. Carotid bruit is not present. No thyromegaly present.  Cardiovascular: Normal rate, regular rhythm, normal heart sounds and intact distal pulses.  Exam reveals no gallop.   Pulmonary/Chest: Effort normal and breath sounds normal. No respiratory distress. He has no wheezes. He exhibits no tenderness.  Abdominal: Soft. Bowel sounds are normal. He exhibits no distension, no abdominal bruit and  no mass. There is no tenderness.  Musculoskeletal: He exhibits no edema or tenderness.  Lymphadenopathy:    He has no cervical adenopathy.  Neurological: He is alert. He has normal reflexes. No cranial nerve deficit. He exhibits normal muscle tone. Coordination normal.  Skin: Skin is warm and dry. No rash noted. No erythema. No pallor.  Ruddy complexion Solar lentigines diffusely    Psychiatric: He has a normal mood and affect.          Assessment & Plan:   Problem List Items Addressed This Visit      Cardiovascular and Mediastinum   Essential hypertension - Primary    bp in fair control at this time  BP Readings from Last 1 Encounters:  07/07/17 110/66   No changes needed Disc lifstyle change with low sodium diet and exercise  Labs ordered Enc wt loss       Relevant Medications   losartan (COZAAR) 50 MG tablet   amLODipine (NORVASC) 5 MG tablet   Other Relevant Orders   CBC with Differential/Platelet   Comprehensive metabolic panel   Lipid panel   TSH     Other   Obesity    Discussed how this problem influences overall health and the risks it imposes  Reviewed plan for weight loss with lower calorie diet (via better food choices and also portion control or program like weight watchers) and exercise building up to or more than 30 minutes 5 days per week including some aerobic activity          Routine general medical examination at a health care facility    Reviewed health habits including diet and exercise and skin cancer prevention Reviewed appropriate screening tests for age  Also reviewed health mt list, fam hx and immunization status , as well as social and family history   See HPI Labs ordered  bp in good control  Enc flu shot in the fall (he will likely get it at work)

## 2017-07-07 NOTE — Assessment & Plan Note (Signed)
Discussed how this problem influences overall health and the risks it imposes  Reviewed plan for weight loss with lower calorie diet (via better food choices and also portion control or program like weight watchers) and exercise building up to or more than 30 minutes 5 days per week including some aerobic activity    

## 2017-07-07 NOTE — Patient Instructions (Addendum)
Don't forget to get a flu shot in the fall  (we should start giving flu shots in the next few weeks - you can get it anywhere)   Keep working on a healthy diet and stay active  Continue sun protection  Drink lots of water in the heat

## 2018-03-12 DIAGNOSIS — J209 Acute bronchitis, unspecified: Secondary | ICD-10-CM | POA: Diagnosis not present

## 2018-03-12 DIAGNOSIS — R05 Cough: Secondary | ICD-10-CM | POA: Diagnosis not present

## 2018-03-12 DIAGNOSIS — J029 Acute pharyngitis, unspecified: Secondary | ICD-10-CM | POA: Diagnosis not present

## 2018-03-12 DIAGNOSIS — R509 Fever, unspecified: Secondary | ICD-10-CM | POA: Diagnosis not present

## 2018-03-14 ENCOUNTER — Telehealth: Payer: Self-pay | Admitting: Family Medicine

## 2018-03-14 NOTE — Telephone Encounter (Signed)
I will see him then Thanks  

## 2018-03-14 NOTE — Telephone Encounter (Signed)
I spoke with pt; pt was seen at  [facility] [address] due to fever, head congestion,prod cough with yellow green phlegm.pt was given a steroid shot and pt was given a zpack; zpack will be completed on 03/17/18. Pt was dx with pneumonia. Pt still has some SOB and nose is congested, no wheezing. Pt scheduled f/u appt with Dr Glori Bickers on 03/18/18 at 10:45. If pt condition worsens prior to appt pt will cb. Pt could not remember name of UC but will find his receipt and ask them to fax med records to Dr Glori Bickers [phone].

## 2018-03-14 NOTE — Telephone Encounter (Unsigned)
Copied from [place] [id number]. Topic: Quick Communication - See Telephone Encounter >> Mar 14, 2018  1:47 PM Hewitt Shorts wrote: Pt went to an urgent care over the weekend and he is wanting to know if Dr. Glori Bickers is going to want to follow up on his visit   Best number [phone]

## 2018-03-18 ENCOUNTER — Telehealth: Payer: Self-pay

## 2018-03-18 ENCOUNTER — Ambulatory Visit (INDEPENDENT_AMBULATORY_CARE_PROVIDER_SITE_OTHER): Payer: BLUE CROSS/BLUE SHIELD | Admitting: Family Medicine

## 2018-03-18 ENCOUNTER — Encounter: Payer: Self-pay | Admitting: Family Medicine

## 2018-03-18 VITALS — BP 128/76 | HR 84 | Temp 97.5°F | Ht 70.25 in | Wt 247.5 lb

## 2018-03-18 DIAGNOSIS — J189 Pneumonia, unspecified organism: Secondary | ICD-10-CM | POA: Diagnosis not present

## 2018-03-18 DIAGNOSIS — Z6835 Body mass index (BMI) 35.0-35.9, adult: Secondary | ICD-10-CM

## 2018-03-18 DIAGNOSIS — Z8701 Personal history of pneumonia (recurrent): Secondary | ICD-10-CM | POA: Insufficient documentation

## 2018-03-18 NOTE — Progress Notes (Signed)
Subjective:    Patient ID: Raymond West, male    DOB: January 08, 1976, 41 y.o.   MRN: [id number]  HPI Here for f/u of pneumonia  Diagnosed at [facility]   [facility]  Had been coughing for a while Last week-got worse/ on Friday started getting achy UC did cxr -diag with bronchitis and pneumonia  Gave him a steroid shot  Put him on zpak  Also claritin   Feeling better (finally) - yesterday and today  Still sweats easily   Not on predinisone  Finished zpak yesterday  Cough is still there-but less than it was  occ productive-not all the time     Wt Readings from Last 3 Encounters:  03/18/18 247 lb 8 oz (112.3 kg)  07/07/17 250 lb 8 oz (113.6 kg)  06/26/16 250 lb 4 oz (113.5 kg)   35.26 kg/m   Pulse ox is 97%  Patient Active Problem List   Diagnosis Date Noted  . CAP (community acquired pneumonia) 03/18/2018  . Obesity 06/26/2016  . Screening for HIV (human immunodeficiency virus) 06/28/2015  . Routine general medical examination at a health care facility 05/27/2012  . Essential hypertension 01/15/2010   Past Medical History:  Diagnosis Date  . Neoplasm of uncertain behavior of skin   . Unspecified essential hypertension    History reviewed. No pertinent surgical history. Social History   Tobacco Use  . Smoking status: Never Smoker  . Smokeless tobacco: Never Used  Substance Use Topics  . Alcohol use: Yes    Alcohol/week: 0.0 oz    Comment: rarely  . Drug use: No   Family History  Problem Relation Age of Onset  . Kidney disease Father        has been on dialysis for years  . Diabetes Father        ? -unsure  . Leukemia Mother   . Diabetes Mother   . Arthritis Maternal Grandfather   . Arthritis Maternal Grandmother   . Arthritis Paternal Grandfather   . Arthritis Paternal Grandmother    Allergies  Allergen Reactions  . Amoxicillin     REACTION: rash  . Benazepril Cough  . Keflex [Cephalexin]     Rash   . Penicillins Rash   Current Outpatient  Medications on File Prior to Visit  Medication Sig Dispense Refill  . amLODipine (NORVASC) 5 MG tablet take 1 tablet by mouth once daily 30 tablet 11  . losartan (COZAAR) 50 MG tablet Take 1 tablet (50 mg total) by mouth daily. 30 tablet 11   No current facility-administered medications on file prior to visit.     Review of Systems  Constitutional: Negative for activity change, appetite change, fatigue, fever and unexpected weight change.  HENT: Negative for congestion, rhinorrhea, sore throat and trouble swallowing.   Eyes: Negative for pain, redness, itching and visual disturbance.  Respiratory: Positive for cough. Negative for chest tightness, shortness of breath and wheezing.        Cough is much improved   Cardiovascular: Negative for chest pain and palpitations.  Gastrointestinal: Negative for abdominal pain, blood in stool, constipation, diarrhea and nausea.  Endocrine: Negative for cold intolerance, heat intolerance, polydipsia and polyuria.  Genitourinary: Negative for difficulty urinating, dysuria, frequency and urgency.  Musculoskeletal: Negative for arthralgias, joint swelling and myalgias.  Skin: Negative for pallor and rash.  Neurological: Negative for dizziness, tremors, weakness, numbness and headaches.  Hematological: Negative for adenopathy. Does not bruise/bleed easily.  Psychiatric/Behavioral: Negative for decreased concentration and dysphoric mood.  The patient is not nervous/anxious.        Objective:   Physical Exam  Constitutional: He is oriented to person, place, and time. He appears well-developed and well-nourished. No distress.  HENT:  Head: Normocephalic and atraumatic.  Nose: Nose normal.  Mouth/Throat: Oropharynx is clear and moist.  Nares are boggy  Eyes: Pupils are equal, round, and reactive to light. Conjunctivae and EOM are normal. Right eye exhibits no discharge. Left eye exhibits no discharge. No scleral icterus.  Neck: Normal range of motion.  Neck supple.  Cardiovascular: Normal rate, regular rhythm and normal heart sounds.  Pulmonary/Chest: Effort normal and breath sounds normal. No stridor. No respiratory distress. He has no wheezes. He has no rales. He exhibits no tenderness.  Musculoskeletal: He exhibits no edema.  Lymphadenopathy:    He has no cervical adenopathy.  Neurological: He is alert and oriented to person, place, and time. He displays normal reflexes. No cranial nerve deficit.  Skin: Skin is warm and dry. No rash noted. He is not diaphoretic. No pallor.  Psychiatric: He has a normal mood and affect.  Pleasant           Assessment & Plan:   Problem List Items Addressed This Visit      Respiratory   CAP (community acquired pneumonia) - Primary    Clinically improved after UC visit/steriods and z pak  Will plan back to work Monday  Reassuring exam  Update if return of any symptoms         Other   Class 2 severe obesity due to excess calories with serious comorbidity and body mass index (BMI) of 35.0 to 35.9 in adult ([place])    Enc healthy diet  Exercise when he is up to it

## 2018-03-18 NOTE — Patient Instructions (Addendum)
I'm glad you are feeling better  Continue delsym for cough as needed Get rest this weekend  You can return to work with no restrictions on 03/21/18   Have the staff scan paperwork on the way out

## 2018-03-18 NOTE — Telephone Encounter (Signed)
Done and in IN box 

## 2018-03-18 NOTE — Telephone Encounter (Signed)
Pt notified letter ready

## 2018-03-18 NOTE — Telephone Encounter (Signed)
Copied from [place]. Topic: General - Other >> Mar 18, 2018  3:14 PM Boyd Kerbs wrote: Reason for CRM:   Pt work has to have a letter (on letterhead )stating that there is no restrictions and when can return to work.  Pt. Will pick up when completed   He is needing this on Monday

## 2018-03-20 NOTE — Assessment & Plan Note (Signed)
Enc healthy diet  Exercise when he is up to it

## 2018-03-20 NOTE — Assessment & Plan Note (Signed)
Clinically improved after UC visit/steriods and z pak  Will plan back to work Monday  Reassuring exam  Update if return of any symptoms

## 2018-07-12 ENCOUNTER — Encounter: Payer: BLUE CROSS/BLUE SHIELD | Admitting: Family Medicine

## 2018-07-18 ENCOUNTER — Other Ambulatory Visit: Payer: Self-pay | Admitting: *Deleted

## 2018-07-18 MED ORDER — LOSARTAN POTASSIUM 50 MG PO TABS: 50.0000 mg | ORAL_TABLET | Freq: Every day | ORAL | 5 refills | Status: DC

## 2018-07-18 MED ORDER — AMLODIPINE BESYLATE 5 MG PO TABS: ORAL_TABLET | ORAL | 5 refills | Status: DC

## 2018-07-18 NOTE — Addendum Note (Signed)
Addended by: Tammi Sou on: 07/18/2018 03:06 PM   Modules accepted: Orders

## 2018-08-04 ENCOUNTER — Ambulatory Visit: Payer: BLUE CROSS/BLUE SHIELD | Admitting: Family Medicine

## 2018-08-04 ENCOUNTER — Encounter: Payer: Self-pay | Admitting: Family Medicine

## 2018-08-04 VITALS — BP 120/78 | HR 77 | Temp 97.7°F | Ht 70.25 in | Wt 259.0 lb

## 2018-08-04 DIAGNOSIS — Z23 Encounter for immunization: Secondary | ICD-10-CM

## 2018-08-04 DIAGNOSIS — M7712 Lateral epicondylitis, left elbow: Secondary | ICD-10-CM | POA: Diagnosis not present

## 2018-08-04 DIAGNOSIS — M7702 Medial epicondylitis, left elbow: Secondary | ICD-10-CM | POA: Diagnosis not present

## 2018-08-04 MED ORDER — METHYLPREDNISOLONE ACETATE 40 MG/ML IJ SUSP
20.0000 mg | Freq: Once | INTRAMUSCULAR | Status: AC
  Administered 2018-08-04: 20 mg via INTRA_ARTICULAR

## 2018-08-04 NOTE — Progress Notes (Signed)
Dr. Frederico Hamman T. Celese Banner, MD, [facility] Medicine [place], [address] Phone: [phone] Fax: [phone]  08/04/2018  Patient: Raymond West, MRN: [id number], DOB: [birth year], 42 y.o.  Primary Physician:  Tower, Wynelle Fanny, MD   Chief Complaint  Patient presents with  . Elbow Pain    Left-Burning/Tingling-Hurts to make a fist   Subjective:   Raymond West presents with lateral elbow pain.  Length of symptoms: 4 mo Hand effected: L  Patient describes a dull ache on the lateral elbow. There is some translation in the proximal forearm and in the distal upper arm. It is painful to lift with the hand facing down and to lift with the thumb in an upright position. Supination is painful. Patient points to the lateral epicondyle as the point of maximal tenderness near ECRB.  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   No trauma.   No prior fractures or operative interventions in the effective hand. Prior PT or HEP: none  Denies numbness or tingling. No significant neck or shoulder pain.  Hand of dominance: R  The PMH, PSH, Social History, Family History, Medications, and allergies have been reviewed in [facility], and have been updated if relevant.  Patient Active Problem List   Diagnosis Date Noted  . CAP (community acquired pneumonia) 03/18/2018  . Class 2 severe obesity due to excess calories with serious comorbidity and body mass index (BMI) of 35.0 to 35.9 in adult ([place]) 06/26/2016  . Screening for HIV (human immunodeficiency virus) 06/28/2015  . Routine general medical examination at a health care facility 05/27/2012  . Essential hypertension 01/15/2010    Past Medical History:  Diagnosis Date  . Neoplasm of uncertain behavior of skin   . Unspecified essential hypertension     History reviewed. No pertinent surgical history.  Social History   Socioeconomic History  . Marital status: Married    Spouse name: Not on file  . Number  of children: Not on file  . Years of education: Not on file  . Highest education level: Not on file  Occupational History  . Occupation: Artist  Social Needs  . Financial resource strain: Not on file  . Food insecurity:    Worry: Not on file    Inability: Not on file  . Transportation needs:    Medical: Not on file    Non-medical: Not on file  Tobacco Use  . Smoking status: Never Smoker  . Smokeless tobacco: Never Used  Substance and Sexual Activity  . Alcohol use: Yes    Alcohol/week: 0.0 standard drinks    Comment: rarely  . Drug use: No  . Sexual activity: Not on file  Lifestyle  . Physical activity:    Days per week: Not on file    Minutes per session: Not on file  . Stress: Not on file  Relationships  . Social connections:    Talks on phone: Not on file    Gets together: Not on file    Attends religious service: Not on file    Active member of club or organization: Not on file    Attends meetings of clubs or organizations: Not on file    Relationship status: Not on file  . Intimate partner violence:    Fear of current or ex partner: Not on file    Emotionally abused: Not on file    Physically abused: Not on file    Forced sexual activity: Not  on file  Other Topics Concern  . 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 Kidney disease Father        has been on dialysis for years  . Diabetes Father        ? -unsure  . Leukemia Mother   . Diabetes Mother   . Arthritis Maternal Grandfather   . Arthritis Maternal Grandmother   . Arthritis Paternal Grandfather   . Arthritis Paternal Grandmother     Allergies  Allergen Reactions  . Amoxicillin     REACTION: rash  . Benazepril Cough  . Keflex [Cephalexin]     Rash   . Penicillins Rash    Medication  list reviewed and updated in full in [place].  GEN: No fevers, chills. Nontoxic. Primarily MSK c/o today. MSK: Detailed in the HPI GI: tolerating PO intake without difficulty Neuro: No numbness, parasthesias, or tingling associated. Otherwise the pertinent positives of the ROS are noted above.   Objective:   Blood pressure 120/78, pulse 77, temperature 97.7 F (36.5 C), temperature source Oral, height 5' 10.25" (1.784 m), weight 259 lb (117.5 kg).  GEN: Well-developed,well-nourished,in no acute distress; alert,appropriate and cooperative throughout examination HEENT: Normocephalic and atraumatic without obvious abnormalities. Ears, externally no deformities PULM: Breathing comfortably in no respiratory distress EXT: No clubbing, cyanosis, or edema PSYCH: Normally interactive. Cooperative during the interview. Pleasant. Friendly and conversant. Not anxious or depressed appearing. Normal, full affect.  L elbow Ecchymosis or edema: neg ROM: full flexion, extension, pronation, supination Shoulder ROM: Full Flexion: 5/5 Extension: 5/5, PAINFUL Supination: 5/5, PAINFUL Pronation: 5/5 Wrist ext: 5/5 Wrist flexion: 5/5 No gross bony abnormality Varus and Valgus stress: stable ECRB tenderness: YES, TTP Medial epicondyle: minimal / mild ttp Lateral epicondyle, resisted wrist extension from wrist full pronation and flexion: PAINFUL grip: 5/5  sensation intact Tinel's, Elbow: negative  Subjective:   Lateral epicondylitis of left elbow - Plan: methylPREDNISolone acetate (DEPO-MEDROL) injection 20 mg  Need for prophylactic vaccination and inoculation against influenza - Plan: Flu Vaccine QUAD 6+ mos PF IM (Fluarix Quad PF)  Medial epicondylitis of left elbow  Elbow anatomy was reviewed, and tendinopathy was explained.  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  I reassured him that this is definitely not nerve  entrapment.  Emphasized stretching an cross-friction massage Emphasized proper palms up lifting biomechanics to unload ECRB  Lateral Epicondylitis Injection, L Date of procedure: 08/04/2018 Verbal consent was obtained from the patient. Risks, benefits, and alternatives were discussed. Potential complications including loss of pigment, atrophy, and rare risk of infection were discussed. Prepped with Chloraprep and Ethyl Chloride used for anesthesia. Under sterile conditions, the patient was injected at the point of maximal tenderness at the ECRB tendon with 1/2 cc of Lidocaine 1% and 1/2 cc of Depo-Medrol 40 mg. Decreased pain after injection. No complications.  Needle size: 22 gauge 1 1/2 inch Medication: Depo-Medrol 20 mg   Meds ordered this encounter  Medications  . methylPREDNISolone acetate (DEPO-MEDROL) injection 20 mg   Orders Placed This Encounter  Procedures  . Flu Vaccine QUAD 6+ mos PF IM (Fluarix Quad PF)  Signed,  Maud Deed. Bridger Pizzi, MD   Patient's Medications  New Prescriptions   No medications on file  Previous Medications   AMLODIPINE (NORVASC) 5 MG TABLET    take 1 tablet by mouth once daily   LORATADINE (CLARITIN) 10 MG TABLET    Take 10 mg by mouth daily.   LOSARTAN (COZAAR) 50 MG TABLET    Take 1 tablet (50 mg total) by mouth daily.  Modified Medications   No medications on file  Discontinued Medications   No medications on file

## 2018-08-18 ENCOUNTER — Ambulatory Visit: Payer: BLUE CROSS/BLUE SHIELD | Admitting: Family Medicine

## 2018-08-18 ENCOUNTER — Encounter: Payer: Self-pay | Admitting: Family Medicine

## 2018-08-18 VITALS — BP 104/70 | HR 83 | Temp 97.6°F | Ht 70.25 in | Wt 255.8 lb

## 2018-08-18 DIAGNOSIS — M25561 Pain in right knee: Secondary | ICD-10-CM | POA: Diagnosis not present

## 2018-08-18 NOTE — Progress Notes (Signed)
Dr. Frederico Hamman T. Roxie Gueye, MD, [facility] and Sports Medicine [place], [address] Phone: [phone] Fax: [phone]  08/18/2018  Patient: Raymond West, MRN: [id number], DOB: 11/26/1975, 42 y.o.  Primary Physician:  Tower, Wynelle Fanny, MD   Chief Complaint  Patient presents with  . Knee Pain    Right   Subjective:   Raymond West is a 42 y.o. very pleasant male patient who presents with the following:  A week ago, was crawling around and hurting quite a bit and started last week. Last week was swollen a lot and a little better. FOr a couple of days could not bend it.  He is already doing quite a bit better, and he no longer has any kind of significant effusion.  His pain was greatest on the medial aspect, but this is been improving and is doing quite a bit better.  He does not have any kind of significant knee history including no history of specific traumas or fractures or surgery.  I recently saw him about 2 weeks ago for the treatment of some lateral epicondylitis.  Past Medical History, Surgical History, Social History, Family History, Problem List, Medications, and Allergies have been reviewed and updated if relevant.  Patient Active Problem List   Diagnosis Date Noted  . CAP (community acquired pneumonia) 03/18/2018  . Class 2 severe obesity due to excess calories with serious comorbidity and body mass index (BMI) of 35.0 to 35.9 in adult ([place]) 06/26/2016  . Screening for HIV (human immunodeficiency virus) 06/28/2015  . Routine general medical examination at a health care facility 05/27/2012  . Essential hypertension 01/15/2010    Past Medical History:  Diagnosis Date  . Neoplasm of uncertain behavior of skin   . Unspecified essential hypertension     History reviewed. No pertinent surgical history.  Social History   Socioeconomic History  . Marital status: Married    Spouse name: Not on file  . Number of children: Not on  file  . Years of education: Not on file  . Highest education level: Not on file  Occupational History  . Occupation: Artist  Social Needs  . Financial resource strain: Not on file  . Food insecurity:    Worry: Not on file    Inability: Not on file  . Transportation needs:    Medical: Not on file    Non-medical: Not on file  Tobacco Use  . Smoking status: Never Smoker  . Smokeless tobacco: Never Used  Substance and Sexual Activity  . Alcohol use: Yes    Alcohol/week: 0.0 standard drinks    Comment: rarely  . Drug use: No  . Sexual activity: Not on file  Lifestyle  . Physical activity:    Days per week: Not on file    Minutes per session: Not on file  . Stress: Not on file  Relationships  . Social connections:    Talks on phone: Not on file    Gets together: Not on file    Attends religious service: Not on file    Active member of club or organization: Not on file    Attends meetings of clubs or organizations: Not on file    Relationship status: Not on file  . Intimate partner violence:    Fear of current or ex partner: Not on file    Emotionally abused: Not on file    Physically abused: Not on file    Forced sexual activity:  Not on file  Other Topics Concern  . 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 Kidney disease Father        has been on dialysis for years  . Diabetes Father        ? -unsure  . Leukemia Mother   . Diabetes Mother   . Arthritis Maternal Grandfather   . Arthritis Maternal Grandmother   . Arthritis Paternal Grandfather   . Arthritis Paternal Grandmother     Allergies  Allergen Reactions  . Amoxicillin     REACTION: rash  . Benazepril Cough  . Keflex [Cephalexin]     Rash   . Penicillins Rash    Medication list reviewed and  updated in full in [place].  GEN: No fevers, chills. Nontoxic. Primarily MSK c/o today. MSK: Detailed in the HPI GI: tolerating PO intake without difficulty Neuro: No numbness, parasthesias, or tingling associated. Otherwise the pertinent positives of the ROS are noted above.   Objective:   BP 104/70   Pulse 83   Temp 97.6 F (36.4 C) (Oral)   Ht 5' 10.25" (1.784 m)   Wt 255 lb 12 oz (116 kg)   BMI 36.44 kg/m    GEN: WDWN, NAD, Non-toxic, Alert & Oriented x 3 HEENT: Atraumatic, Normocephalic.  Ears and Nose: No external deformity. EXTR: No clubbing/cyanosis/edema NEURO: Normal gait.  PSYCH: Normally interactive. Conversant. Not depressed or anxious appearing.  Calm demeanor.    Right knee: Full extension.  Flexion to 135 degrees.  Minimal effusion.  Stable to varus and valgus stress.  Lachman is negative. Bounce home test, McMurray's test, and flexion pinch test are all negative.  He does have some mild medial joint line tenderness.  Radiology: No results found.  Assessment and Plan:   Acute pain of right knee  Reasonably benign knee exam, and is clinically improving in a short time.  Meniscal contusion would be highest on the differential.  At this point I would not do anything further other than basic range of motion, ice, and NSAIDs.  Follow-up: No follow-ups on file.  Signed,  Maud Deed. Saher Davee, MD   Allergies as of 08/18/2018      Reactions   Amoxicillin    REACTION: rash   Benazepril Cough   Keflex [cephalexin]    Rash   Penicillins Rash      Medication List        Accurate as of 08/18/18 11:59 PM. Always use your most recent med list.          amLODipine 5 MG tablet Commonly known as:  NORVASC take 1 tablet by mouth once daily   loratadine 10 MG tablet Commonly known as:  CLARITIN Take 10 mg by mouth daily.   losartan 50 MG tablet Commonly known as:  COZAAR Take 1 tablet (50 mg total) by mouth daily.

## 2018-08-26 ENCOUNTER — Ambulatory Visit (INDEPENDENT_AMBULATORY_CARE_PROVIDER_SITE_OTHER): Payer: BLUE CROSS/BLUE SHIELD | Admitting: Family Medicine

## 2018-08-26 ENCOUNTER — Encounter: Payer: Self-pay | Admitting: Family Medicine

## 2018-08-26 VITALS — BP 122/72 | HR 80 | Temp 98.4°F | Ht 70.25 in | Wt 254.2 lb

## 2018-08-26 DIAGNOSIS — Z23 Encounter for immunization: Secondary | ICD-10-CM | POA: Diagnosis not present

## 2018-08-26 DIAGNOSIS — Z8701 Personal history of pneumonia (recurrent): Secondary | ICD-10-CM

## 2018-08-26 DIAGNOSIS — I10 Essential (primary) hypertension: Secondary | ICD-10-CM | POA: Diagnosis not present

## 2018-08-26 DIAGNOSIS — Z0000 Encounter for general adult medical examination without abnormal findings: Secondary | ICD-10-CM | POA: Diagnosis not present

## 2018-08-26 DIAGNOSIS — Z6835 Body mass index (BMI) 35.0-35.9, adult: Secondary | ICD-10-CM

## 2018-08-26 NOTE — Patient Instructions (Addendum)
Pneumonia vaccine today    Take care of yourself  Stay active- in and out of work  Eat a healthy diet - less processed food and junk/ more fruit and veggies   Continue sunscreen   Here is a handout on vasectomy to consider   Labs today

## 2018-08-26 NOTE — Progress Notes (Signed)
Subjective:    Patient ID: Raymond West, male    DOB: July 13, 1976, 42 y.o.   MRN: [id number]  HPI  Here for health maintenance exam and to review chronic medical problems    Some orthopedic problems lately- knee and elbow  Physical job   Had a cold recently -much better    Due for labs   Tetanus shot 9/11 Tdap   HIV screen neg 8/16 Flu shot 9/26  Wt Readings from Last 3 Encounters:  08/26/18 254 lb 4 oz (115.3 kg)  08/18/18 255 lb 12 oz (116 kg)  08/04/18 259 lb (117.5 kg)  lost 5 lb since last month  Physical job  Avoids junk and fast food 36.22 kg/m   bp is stable today  No cp or palpitations or headaches or edema  No side effects to medicines  BP Readings from Last 3 Encounters:  08/26/18 122/72  08/18/18 104/70  08/04/18 120/78    Does well with losartan   Has pneumonia in May  Has not had a pneumonia vaccine   Family hx :-nothing new   Prostate health  No nocturia  No problems with flow No prostate cancer in the family   Patient Active Problem List   Diagnosis Date Noted  . CAP (community acquired pneumonia) 03/18/2018  . Class 2 severe obesity due to excess calories with serious comorbidity and body mass index (BMI) of 35.0 to 35.9 in adult ([place]) 06/26/2016  . Screening for HIV (human immunodeficiency virus) 06/28/2015  . Routine general medical examination at a health care facility 05/27/2012  . Essential hypertension 01/15/2010   Past Medical History:  Diagnosis Date  . Neoplasm of uncertain behavior of skin   . Unspecified essential hypertension    History reviewed. No pertinent surgical history. Social History   Tobacco Use  . Smoking status: Never Smoker  . Smokeless tobacco: Never Used  Substance Use Topics  . Alcohol use: Yes    Alcohol/week: 0.0 standard drinks    Comment: rarely  . Drug use: No   Family History  Problem Relation Age of Onset  . Kidney disease Father        has been on dialysis for years  . Diabetes Father          ? -unsure  . Leukemia Mother   . Diabetes Mother   . Arthritis Maternal Grandfather   . Arthritis Maternal Grandmother   . Arthritis Paternal Grandfather   . Arthritis Paternal Grandmother    Allergies  Allergen Reactions  . Amoxicillin     REACTION: rash  . Benazepril Cough  . Keflex [Cephalexin]     Rash   . Penicillins Rash   Current Outpatient Medications on File Prior to Visit  Medication Sig Dispense Refill  . amLODipine (NORVASC) 5 MG tablet take 1 tablet by mouth once daily 30 tablet 5  . loratadine (CLARITIN) 10 MG tablet Take 10 mg by mouth daily.  1  . losartan (COZAAR) 50 MG tablet Take 1 tablet (50 mg total) by mouth daily. 30 tablet 5   No current facility-administered medications on file prior to visit.      Review of Systems  Constitutional: Negative for activity change, appetite change, fatigue, fever and unexpected weight change.  HENT: Negative for congestion, rhinorrhea, sore throat and trouble swallowing.   Eyes: Negative for pain, redness, itching and visual disturbance.  Respiratory: Negative for cough, chest tightness, shortness of breath and wheezing.   Cardiovascular: Negative for chest pain  and palpitations.  Gastrointestinal: Negative for abdominal pain, blood in stool, constipation, diarrhea and nausea.  Endocrine: Negative for cold intolerance, heat intolerance, polydipsia and polyuria.  Genitourinary: Negative for difficulty urinating, dysuria, frequency and urgency.  Musculoskeletal: Negative for arthralgias, joint swelling and myalgias.       Recent knee and elbow problems   Skin: Negative for pallor and rash.  Neurological: Negative for dizziness, tremors, weakness, numbness and headaches.  Hematological: Negative for adenopathy. Does not bruise/bleed easily.  Psychiatric/Behavioral: Negative for decreased concentration and dysphoric mood. The patient is not nervous/anxious.        Objective:   Physical Exam  Constitutional: He  appears well-developed and well-nourished. No distress.  obese and well appearing   HENT:  Head: Normocephalic and atraumatic.  Right Ear: External ear normal.  Left Ear: External ear normal.  Nose: Nose normal.  Mouth/Throat: Oropharynx is clear and moist.  Eyes: Pupils are equal, round, and reactive to light. Conjunctivae and EOM are normal. Right eye exhibits no discharge. Left eye exhibits no discharge. No scleral icterus.  Neck: Normal range of motion. Neck supple. No JVD present. Carotid bruit is not present. No thyromegaly present.  Cardiovascular: Normal rate, regular rhythm, normal heart sounds and intact distal pulses. Exam reveals no gallop.  Pulmonary/Chest: Effort normal and breath sounds normal. No respiratory distress. He has no wheezes. He has no rales. He exhibits no tenderness.  Abdominal: Soft. Bowel sounds are normal. He exhibits no distension, no abdominal bruit and no mass. There is no tenderness.  Musculoskeletal: He exhibits no edema or tenderness.  Lymphadenopathy:    He has no cervical adenopathy.  Neurological: He is alert. He has normal reflexes. He displays normal reflexes. No cranial nerve deficit. He exhibits normal muscle tone. Coordination normal.  Skin: Skin is warm and dry. No rash noted. No erythema. No pallor.  Ruddy complexion Some benign appearing brown nevi on trunk  Psychiatric: He has a normal mood and affect.  Pleasant           Assessment & Plan:   Problem List Items Addressed This Visit      Cardiovascular and Mediastinum   Essential hypertension    bp in fair control at this time  BP Readings from Last 1 Encounters:  08/26/18 122/72   No changes needed Most recent labs reviewed  Disc lifstyle change with low sodium diet and exercise        Relevant Orders   CBC with Differential/Platelet (Completed)   Comprehensive metabolic panel (Completed)   Lipid panel (Completed)   TSH (Completed)     Other   Class 2 severe obesity  due to excess calories with serious comorbidity and body mass index (BMI) of 35.0 to 35.9 in adult [facility])    Discussed how this problem influences overall health and the risks it imposes  Reviewed plan for weight loss with lower calorie diet (via better food choices and also portion control or program like weight watchers) and exercise building up to or more than 30 minutes 5 days per week including some aerobic activity   Has active job  Disc dietary changes      Relevant Orders   CBC with Differential/Platelet (Completed)   Comprehensive metabolic panel (Completed)   Lipid panel (Completed)   TSH (Completed)   H/O: pneumonia    H/o CAP in may  Will immunize with pneumovax 23 today      Routine general medical examination at a health care facility - Primary  Reviewed health habits including diet and exercise and skin cancer prevention Reviewed appropriate screening tests for age  Also reviewed health mt list, fam hx and immunization status , as well as social and family history   See HPI Labs ordered  Pneumovax (in light of h/o CAP in the past)  Disc balanced diet with goal of wt loss  Also use of sun protection  Interested in vasectomy-info given/ will call for urol ref when ready        Relevant Orders   CBC with Differential/Platelet (Completed)   Comprehensive metabolic panel (Completed)   Lipid panel (Completed)   TSH (Completed)    Other Visit Diagnoses    Need for 23-polyvalent pneumococcal polysaccharide vaccine       Relevant Orders   Pneumococcal polysaccharide vaccine 23-valent greater than or equal to 2yo subcutaneous/IM (Completed)

## 2018-08-27 LAB — COMPREHENSIVE METABOLIC PANEL
AG RATIO: 1.8 (calc) (ref 1.0–2.5)
ALKALINE PHOSPHATASE (APISO): 63 U/L (ref 40–115)
ALT: 33 U/L (ref 9–46)
AST: 28 U/L (ref 10–40)
Albumin: 4.6 g/dL (ref 3.6–5.1)
BUN: 18 mg/dL (ref 7–25)
CO2: 27 mmol/L (ref 20–32)
Calcium: 9.8 mg/dL (ref 8.6–10.3)
Chloride: 101 mmol/L (ref 98–110)
Creat: 0.93 mg/dL (ref 0.60–1.35)
GLOBULIN: 2.6 g/dL (ref 1.9–3.7)
GLUCOSE: 85 mg/dL (ref 65–99)
Potassium: 4.4 mmol/L (ref 3.5–5.3)
Sodium: 139 mmol/L (ref 135–146)
Total Bilirubin: 0.6 mg/dL (ref 0.2–1.2)
Total Protein: 7.2 g/dL (ref 6.1–8.1)

## 2018-08-27 LAB — CBC WITH DIFFERENTIAL/PLATELET
BASOS ABS: 48 {cells}/uL (ref 0–200)
Basophils Relative: 0.5 %
Eosinophils Absolute: 221 cells/uL (ref 15–500)
Eosinophils Relative: 2.3 %
HCT: 44.8 % (ref 38.5–50.0)
Hemoglobin: 15.1 g/dL (ref 13.2–17.1)
Lymphs Abs: 3110 cells/uL (ref 850–3900)
MCH: 28.3 pg (ref 27.0–33.0)
MCHC: 33.7 g/dL (ref 32.0–36.0)
MCV: 84.1 fL (ref 80.0–100.0)
MONOS PCT: 7.9 %
MPV: 10.5 fL (ref 7.5–12.5)
NEUTROS PCT: 56.9 %
Neutro Abs: 5462 cells/uL (ref 1500–7800)
Platelets: 255 10*3/uL (ref 140–400)
RBC: 5.33 10*6/uL (ref 4.20–5.80)
RDW: 12.6 % (ref 11.0–15.0)
Total Lymphocyte: 32.4 %
WBC mixed population: 758 cells/uL (ref 200–950)
WBC: 9.6 10*3/uL (ref 3.8–10.8)

## 2018-08-27 LAB — LIPID PANEL
CHOL/HDL RATIO: 5.1 (calc) — AB (ref <5.0)
CHOLESTEROL: 193 mg/dL (ref <200)
HDL: 38 mg/dL — ABNORMAL LOW (ref >40)
LDL Cholesterol (Calc): 136 mg/dL (calc) — ABNORMAL HIGH
Non-HDL Cholesterol (Calc): 155 mg/dL (calc) — ABNORMAL HIGH (ref <130)
Triglycerides: 93 mg/dL (ref <150)

## 2018-08-27 LAB — TSH: TSH: 2.28 mIU/L (ref 0.40–4.50)

## 2018-08-28 NOTE — Assessment & Plan Note (Signed)
bp in fair control at this time  BP Readings from Last 1 Encounters:  08/26/18 122/72   No changes needed Most recent labs reviewed  Disc lifstyle change with low sodium diet and exercise

## 2018-08-28 NOTE — Assessment & Plan Note (Addendum)
Reviewed health habits including diet and exercise and skin cancer prevention Reviewed appropriate screening tests for age  Also reviewed health mt list, fam hx and immunization status , as well as social and family history   See HPI Labs ordered  Pneumovax (in light of h/o CAP in the past)  Disc balanced diet with goal of wt loss  Also use of sun protection  Interested in vasectomy-info given/ will call for urol ref when ready

## 2018-08-28 NOTE — Assessment & Plan Note (Signed)
Discussed how this problem influences overall health and the risks it imposes  Reviewed plan for weight loss with lower calorie diet (via better food choices and also portion control or program like weight watchers) and exercise building up to or more than 30 minutes 5 days per week including some aerobic activity   Has active job  Disc dietary changes

## 2018-08-28 NOTE — Assessment & Plan Note (Signed)
H/o CAP in may  Will immunize with pneumovax 23 today

## 2018-12-05 ENCOUNTER — Other Ambulatory Visit: Payer: Self-pay

## 2018-12-05 NOTE — Telephone Encounter (Signed)
Received fax from [facility] in [place] asking for allergy medication refill per patient's request but does not say what medication. On file we just have Loratadine. Left message for the patient to verify if that is the correct medication and does he want 30 or 90 day supply.

## 2018-12-07 MED ORDER — LORATADINE 10 MG PO TABS: 10.0000 mg | ORAL_TABLET | Freq: Every day | ORAL | 2 refills | Status: DC | PRN

## 2018-12-07 NOTE — Telephone Encounter (Signed)
Called pharmacy and they advise me it was the loratadine, Rx sent

## 2018-12-22 ENCOUNTER — Other Ambulatory Visit: Payer: Self-pay | Admitting: Family Medicine

## 2018-12-23 ENCOUNTER — Telehealth: Payer: Self-pay | Admitting: *Deleted

## 2018-12-23 MED ORDER — LOSARTAN POTASSIUM 100 MG PO TABS: 50.0000 mg | ORAL_TABLET | Freq: Every day | ORAL | 3 refills | Status: DC

## 2018-12-23 NOTE — Telephone Encounter (Signed)
I sent in the 100 mg dose to split

## 2018-12-23 NOTE — Telephone Encounter (Signed)
Spoke to pharmacist, [place], who states losartan 50mg  is currently on back order. She is requesting a new Rx for 100mg  or 25mg , which is currently in stock,  with new instruction. pls advise

## 2018-12-28 DIAGNOSIS — Z1283 Encounter for screening for malignant neoplasm of skin: Secondary | ICD-10-CM | POA: Diagnosis not present

## 2018-12-28 DIAGNOSIS — D229 Melanocytic nevi, unspecified: Secondary | ICD-10-CM | POA: Diagnosis not present

## 2018-12-28 DIAGNOSIS — D485 Neoplasm of uncertain behavior of skin: Secondary | ICD-10-CM | POA: Diagnosis not present

## 2019-02-05 ENCOUNTER — Other Ambulatory Visit: Payer: Self-pay

## 2019-02-05 ENCOUNTER — Emergency Department
Admission: EM | Admit: 2019-02-05 | Discharge: 2019-02-05 | Disposition: A | Discharge status: Home / Self Care | Payer: 59 | Attending: Emergency Medicine | Admitting: Emergency Medicine

## 2019-02-05 ENCOUNTER — Encounter: Payer: Self-pay | Admitting: Emergency Medicine

## 2019-02-05 DIAGNOSIS — I10 Essential (primary) hypertension: Secondary | ICD-10-CM | POA: Diagnosis not present

## 2019-02-05 DIAGNOSIS — B029 Zoster without complications: Secondary | ICD-10-CM | POA: Diagnosis not present

## 2019-02-05 DIAGNOSIS — R51 Headache: Secondary | ICD-10-CM | POA: Diagnosis not present

## 2019-02-05 DIAGNOSIS — D485 Neoplasm of uncertain behavior of skin: Secondary | ICD-10-CM | POA: Insufficient documentation

## 2019-02-05 DIAGNOSIS — K047 Periapical abscess without sinus: Secondary | ICD-10-CM | POA: Insufficient documentation

## 2019-02-05 DIAGNOSIS — Z79899 Other long term (current) drug therapy: Secondary | ICD-10-CM | POA: Insufficient documentation

## 2019-02-05 DIAGNOSIS — K0889 Other specified disorders of teeth and supporting structures: Secondary | ICD-10-CM | POA: Diagnosis present

## 2019-02-05 DIAGNOSIS — R52 Pain, unspecified: Secondary | ICD-10-CM | POA: Diagnosis not present

## 2019-02-05 DIAGNOSIS — R42 Dizziness and giddiness: Secondary | ICD-10-CM | POA: Diagnosis not present

## 2019-02-05 LAB — COMPREHENSIVE METABOLIC PANEL
ALT: 33 U/L (ref 0–44)
AST: 23 U/L (ref 15–41)
Albumin: 4.4 g/dL (ref 3.5–5.0)
Alkaline Phosphatase: 53 U/L (ref 38–126)
Anion gap: 9 (ref 5–15)
BUN: 15 mg/dL (ref 6–20)
CO2: 26 mmol/L (ref 22–32)
Calcium: 9.1 mg/dL (ref 8.9–10.3)
Chloride: 101 mmol/L (ref 98–111)
Creatinine, Ser: 0.94 mg/dL (ref 0.61–1.24)
GFR calc Af Amer: >60 mL/min (ref >60)
GFR calc non Af Amer: >60 mL/min (ref >60)
Glucose, Bld: 116 mg/dL — ABNORMAL HIGH (ref 70–99)
Potassium: 3.8 mmol/L (ref 3.5–5.1)
Sodium: 136 mmol/L (ref 135–145)
Total Bilirubin: 1.1 mg/dL (ref 0.3–1.2)
Total Protein: 7.9 g/dL (ref 6.5–8.1)

## 2019-02-05 LAB — CBC WITH DIFFERENTIAL/PLATELET
Abs Immature Granulocytes: 0.04 10*3/uL (ref 0.00–0.07)
BASOS ABS: 0 10*3/uL (ref 0.0–0.1)
Basophils Relative: 0 %
Eosinophils Absolute: 0 10*3/uL (ref 0.0–0.5)
Eosinophils Relative: 0 %
HEMATOCRIT: 42.8 % (ref 39.0–52.0)
Hemoglobin: 14.6 g/dL (ref 13.0–17.0)
Immature Granulocytes: 0 %
LYMPHS ABS: 2 10*3/uL (ref 0.7–4.0)
Lymphocytes Relative: 21 %
MCH: 29.2 pg (ref 26.0–34.0)
MCHC: 34.1 g/dL (ref 30.0–36.0)
MCV: 85.6 fL (ref 80.0–100.0)
Monocytes Absolute: 1 10*3/uL (ref 0.1–1.0)
Monocytes Relative: 11 %
Neutro Abs: 6.1 10*3/uL (ref 1.7–7.7)
Neutrophils Relative %: 68 %
Platelets: 189 10*3/uL (ref 150–400)
RBC: 5 MIL/uL (ref 4.22–5.81)
RDW: 13 % (ref 11.5–15.5)
WBC: 9.1 10*3/uL (ref 4.0–10.5)
nRBC: 0 % (ref 0.0–0.2)

## 2019-02-05 LAB — URINALYSIS, COMPLETE (UACMP) WITH MICROSCOPIC
Bacteria, UA: NONE SEEN
Bilirubin Urine: NEGATIVE
Glucose, UA: NEGATIVE mg/dL
Ketones, ur: 5 mg/dL — AB
Leukocytes,Ua: NEGATIVE
Nitrite: NEGATIVE
Protein, ur: NEGATIVE mg/dL
Specific Gravity, Urine: 1.024 (ref 1.005–1.030)
Squamous Epithelial / HPF: NONE SEEN (ref 0–5)
pH: 5 (ref 5.0–8.0)

## 2019-02-05 LAB — LACTIC ACID, PLASMA
Lactic Acid, Venous: 1 mmol/L (ref 0.5–1.9)
Lactic Acid, Venous: 1.3 mmol/L (ref 0.5–1.9)

## 2019-02-05 MED ORDER — CLINDAMYCIN PHOSPHATE 600 MG/50ML IV SOLN
600.0000 mg | Freq: Once | INTRAVENOUS | Status: AC
  Administered 2019-02-05: 600 mg via INTRAVENOUS
  Filled 2019-02-05: qty 50

## 2019-02-05 MED ORDER — OXYCODONE-ACETAMINOPHEN 5-325 MG PO TABS
2.0000 | ORAL_TABLET | Freq: Once | ORAL | Status: AC
  Administered 2019-02-05: 2 via ORAL
  Filled 2019-02-05: qty 2

## 2019-02-05 MED ORDER — FAMCICLOVIR 500 MG PO TABS: 500.0000 mg | ORAL_TABLET | Freq: Three times a day (TID) | ORAL | 0 refills | Status: AC

## 2019-02-05 MED ORDER — CLINDAMYCIN HCL 150 MG PO CAPS: ORAL_CAPSULE | ORAL | 0 refills | Status: DC

## 2019-02-05 MED ORDER — SODIUM CHLORIDE 0.9% FLUSH: 3.0000 mL | Freq: Once | INTRAVENOUS | Status: DC

## 2019-02-05 MED ORDER — OXYCODONE-ACETAMINOPHEN 10-325 MG PO TABS: 1.0000 | ORAL_TABLET | Freq: Four times a day (QID) | ORAL | 0 refills | Status: DC | PRN

## 2019-02-05 NOTE — ED Notes (Signed)
First nurse note: Pt here via EMS with c/o dizziness, headache, face pain from tooth abscess. Has been treated with zpack with no relief. NAD.  Vitals per EMS: 134/80; HR 70; BG 123; 97% RA.

## 2019-02-05 NOTE — ED Provider Notes (Signed)
[facility] Emergency Department Provider Note  ____________________________________________   First MD Initiated Contact with Patient 02/05/19 1447     (approximate)  I have reviewed the triage vital signs and the nursing notes.   HISTORY  Chief Complaint Dental Abscess   HPI Raymond West is a 43 y.o. male presents to the ED via care for [facility] with complaint of dental infection to the right lower jaw.  Patient states that he was seen by the dentist on Thursday and placed on Zithromax which has not helped.  He also noted that yesterday he had some swelling to the right side of his face with increased pain without any relief with over-the-counter medications.  He also relates that he initially began with a headache on Friday.  Patient already has an appointment with his dentist for follow-up after finishing the Z-Pak.  He rates pain as 5 out of 10.     Past Medical History:  Diagnosis Date  . Neoplasm of uncertain behavior of skin   . Unspecified essential hypertension     Patient Active Problem List   Diagnosis Date Noted  . H/O: pneumonia 03/18/2018  . Class 2 severe obesity due to excess calories with serious comorbidity and body mass index (BMI) of 35.0 to 35.9 in adult ([place]) 06/26/2016  . Routine general medical examination at a health care facility 05/27/2012  . Essential hypertension 01/15/2010    History reviewed. No pertinent surgical history.  Prior to Admission medications   Medication Sig Start Date End Date Taking? Authorizing Provider  amLODipine (NORVASC) 5 MG tablet TAKE 1 TABLET BY MOUTH EVERY DAY 12/22/18  Yes Tower, Marne A, MD  azithromycin (ZITHROMAX) 250 MG tablet Take 250-500 mg by mouth daily. 02/02/19  Yes [provider]  losartan (COZAAR) 100 MG tablet Take 0.5 tablets (50 mg total) by mouth daily. 12/23/18  Yes Tower, Wynelle Fanny, MD  clindamycin (CLEOCIN) 150 MG capsule Take 2 capsules tid x 7 days 02/05/19   Johnn Hai, PA-C  famciclovir (FAMVIR) 500 MG tablet Take 1 tablet (500 mg total) by mouth 3 (three) times daily for 10 days. 02/05/19 02/15/19  Johnn Hai, PA-C  loratadine (CLARITIN) 10 MG tablet Take 1 tablet (10 mg total) by mouth daily as needed for allergies. 12/07/18   Tower, Wynelle Fanny, MD  oxyCODONE-acetaminophen (PERCOCET) 10-325 MG tablet Take 1 tablet by mouth every 6 (six) hours as needed for pain. 02/05/19 02/05/20  Johnn Hai, PA-C    Allergies Amoxicillin; Benazepril; Keflex [cephalexin]; and Penicillins  Family History  Problem Relation Age of Onset  . Leukemia Mother   . Diabetes Mother   . Kidney disease Father        has been on dialysis for years  . Diabetes Father        ? -unsure  . Arthritis Maternal Grandfather   . Arthritis Maternal Grandmother   . Arthritis Paternal Grandfather   . Arthritis Paternal Grandmother     Social History Social History   Tobacco Use  . Smoking status: Never Smoker  . Smokeless tobacco: Never Used  Substance Use Topics  . Alcohol use: Yes    Alcohol/week: 0.0 standard drinks    Comment: rarely  . Drug use: No    Review of Systems Constitutional: No fever/chills Eyes: No visual changes. ENT: No sore throat.  Does not for dental pain. Cardiovascular: Denies chest pain. Respiratory: Denies shortness of breath. Gastrointestinal: No abdominal pain.  No  nausea, no vomiting.  Musculoskeletal: Negative for muscle skeletal pain. Skin: Positive for rash. Neurological: Negative for headaches, focal weakness or numbness. ___________________________________________   PHYSICAL EXAM:  VITAL SIGNS: ED Triage Vitals [02/05/19 1335]  Enc Vitals Group     BP 140/88     Pulse Rate 78     Resp 18     Temp 99.2 F (37.3 C)     Temp Source Oral     SpO2 98 %     Weight 250 lb (113.4 kg)     Height 6' (1.829 m)     Head Circumference      Peak Flow      Pain Score 5     Pain Loc      Pain Edu?      Excl. in [place]?     Constitutional: Alert and oriented. Well appearing and in no acute distress. Eyes: Conjunctivae are normal.  No injection or drainage from the eye. Head: Atraumatic. Mouth: On examination of the right upper and lower molars there is no obvious abscess noted.  There is some minimal tenderness on palpation of the gums.  No drainage is present. Neck: No stridor.   Cardiovascular: Normal rate, regular rhythm. Grossly normal heart sounds.  Good peripheral circulation. Respiratory: Normal respiratory effort.  No retractions. Lungs CTAB. Musculoskeletal: Moves upper and lower extremities without any difficulty.  Normal gait was noted. Neurologic:  Normal speech and language. No gross focal neurologic deficits are appreciated. No gait instability. Skin:  Skin is warm, dry.  On the right side of the face extending up into the right scalp area there is clusters of small clear vesicles noted.  Each are on an erythematous base and also extend down to the right chin area.  Areas are moderately tender to palpation.  No open areas are not that at this time. Psychiatric: Mood and affect are normal. Speech and behavior are normal.  ____________________________________________   LABS (all labs ordered are listed, but only abnormal results are displayed)  Labs Reviewed  COMPREHENSIVE METABOLIC PANEL - Abnormal; Notable for the following components:      Result Value   Glucose, Bld 116 (*)    All other components within normal limits  URINALYSIS, COMPLETE (UACMP) WITH MICROSCOPIC - Abnormal; Notable for the following components:   Color, Urine YELLOW (*)    APPearance HAZY (*)    Hgb urine dipstick SMALL (*)    Ketones, ur 5 (*)    All other components within normal limits  LACTIC ACID, PLASMA  LACTIC ACID, PLASMA  CBC WITH DIFFERENTIAL/PLATELET     PROCEDURES  Procedure(s) performed (including Critical Care):  Procedures   ____________________________________________   INITIAL IMPRESSION /  ASSESSMENT AND PLAN / ED COURSE  As part of my medical decision making, I reviewed the following data within the electronic MEDICAL RECORD NUMBER Notes from prior ED visits and [employer] Controlled Substance Database  Patient presents to the ED with complaint of severe dental abscess that is making his face swell.  On examination however it is noted that he has a vesicular rash on the right side of his face that started yesterday.  Patient was made aware that this is shingles which is causing his pain and most likely his headache.  The dental abscess seen by his dentist is less remarkable than his shingles.  Patient has already finished a Z-Pak.  He was given a loading dose of clindamycin for his dental abscess and also a prescription for  the same.  He was also given a prescription for Famvir for the next 10 days and Percocet as needed for pain.  He was also given a note to remain out of work.  ____________________________________________   FINAL CLINICAL IMPRESSION(S) / ED DIAGNOSES  Final diagnoses:  Herpes zoster without complication  Dental abscess     ED Discharge Orders         Ordered    famciclovir (FAMVIR) 500 MG tablet  3 times daily     02/05/19 1543    oxyCODONE-acetaminophen (PERCOCET) 10-325 MG tablet  Every 6 hours PRN     02/05/19 1543    clindamycin (CLEOCIN) 150 MG capsule     02/05/19 1546           Note:  This document was prepared using Dragon voice recognition software and may include unintentional dictation errors.    Johnn Hai, PA-C 02/05/19 1646    Arta Silence, MD 02/06/19 1104

## 2019-02-05 NOTE — ED Triage Notes (Signed)
Pt presents to ED via [facility] with c/o dental infection to R lower jaw. Per patient he has been on abx for dental abscess x several days with noted swelling to chin and R side of face without relief. Pt presents with 20g to L AC.

## 2019-02-05 NOTE — Discharge Instructions (Addendum)
Begin taking Famvir 500 mg as directed until completely finished.  Also the clindamycin is an antibiotic for your dental abscess.  The Percocet is 1 every 6 hours as needed for pain.  Do not take this medication and drive or operate machinery.  This medication also may cause drowsiness.  You also need to make an appointment with [facility] for evaluation of your right eye since the shingles can involve the eye and cause problems with your vision.  Do not pop the blisters that are on your face or scalp.  Also try to avoid being around elderly people and also anyone receiving chemotherapy or that might be pregnant.

## 2019-02-07 ENCOUNTER — Other Ambulatory Visit: Payer: Self-pay

## 2019-02-07 ENCOUNTER — Encounter: Payer: Self-pay | Admitting: Family Medicine

## 2019-02-07 ENCOUNTER — Ambulatory Visit (INDEPENDENT_AMBULATORY_CARE_PROVIDER_SITE_OTHER): Payer: 59 | Admitting: Family Medicine

## 2019-02-07 DIAGNOSIS — K047 Periapical abscess without sinus: Secondary | ICD-10-CM | POA: Diagnosis not present

## 2019-02-07 DIAGNOSIS — B029 Zoster without complications: Secondary | ICD-10-CM

## 2019-02-07 DIAGNOSIS — B0229 Other postherpetic nervous system involvement: Secondary | ICD-10-CM | POA: Insufficient documentation

## 2019-02-07 MED ORDER — TRAMADOL HCL 50 MG PO TABS: 50.0000 mg | ORAL_TABLET | Freq: Three times a day (TID) | ORAL | 0 refills | Status: DC | PRN

## 2019-02-07 MED ORDER — MECLIZINE HCL 25 MG PO TABS: 25.0000 mg | ORAL_TABLET | Freq: Three times a day (TID) | ORAL | 1 refills | Status: DC | PRN

## 2019-02-07 NOTE — Progress Notes (Signed)
Virtual Visit via Video Note  I connected with Raymond West on 02/07/19 at 10:00 AM EDT by a video enabled telemedicine application and verified that I am speaking with the correct person using two identifiers. The patient is at home and I am in my office    I discussed the limitations of evaluation and management by telemedicine and the availability of in person appointments. The patient expressed understanding and agreed to proceed.  History of Present Illness:  He was seen in the ED on 3/29  (by ems)  Had presented for pain in R lower jaw / with history of a dental infection  Was px zpak by his dentist  ED provider noted a vesicular rash on R side of face -diagnosed with shingles  Exam note: Skin:  Skin is warm, dry.  On the right side of the face extending up into the right scalp area there is clusters of small clear vesicles noted.  Each are on an erythematous base and also extend down to the right chin area.  Areas are moderately tender to palpation.  No open areas are not that at this time. He was given a loading dose of clindamycin for his abscess and home px  Px famvir for 10 days for the zoster  Also percocet for pain (20 tabs with inst take 1 po Q 6 hours prn pain)   He did not tolerate the percocet - caused GI symptoms  Helps with pain but makes him very nauseated    He cannot take pcn or keflex   Dentist told him that the abscess triggered his shingles  He is scheduled for wisdom tooth extraction on Friday   Told in ER he needed to go to the eye doctor   Ear and facial pain are severe He can hear (some drainage from ear)  Very dizzy - worse with movement/pos change  Better lying down   No rash on nose  Rash is at least 2 inches from eye (minimal there)  Is sensitive to light   Patient Active Problem List   Diagnosis Date Noted  . Shingles 02/07/2019  . Dental abscess 02/07/2019  . H/O: pneumonia 03/18/2018  . Class 2 severe obesity due to excess calories with  serious comorbidity and body mass index (BMI) of 35.0 to 35.9 in adult ([place]) 06/26/2016  . Routine general medical examination at a health care facility 05/27/2012  . Essential hypertension 01/15/2010   Past Medical History:  Diagnosis Date  . Neoplasm of uncertain behavior of skin   . Unspecified essential hypertension    No past surgical history on file. Social History   Tobacco Use  . Smoking status: Never Smoker  . Smokeless tobacco: Never Used  Substance Use Topics  . Alcohol use: Yes    Alcohol/week: 0.0 standard drinks    Comment: rarely  . Drug use: No   Family History  Problem Relation Age of Onset  . Leukemia Mother   . Diabetes Mother   . Kidney disease Father        has been on dialysis for years  . Diabetes Father        ? -unsure  . Arthritis Maternal Grandfather   . Arthritis Maternal Grandmother   . Arthritis Paternal Grandfather   . Arthritis Paternal Grandmother    Allergies  Allergen Reactions  . Amoxicillin     REACTION: rash  . Benazepril Cough  . Keflex [Cephalexin]     Rash   . Penicillins Rash  Current Outpatient Medications on File Prior to Visit  Medication Sig Dispense Refill  . amLODipine (NORVASC) 5 MG tablet TAKE 1 TABLET BY MOUTH EVERY DAY 90 tablet 2  . clindamycin (CLEOCIN) 150 MG capsule Take 2 capsules tid x 7 days 42 capsule 0  . famciclovir (FAMVIR) 500 MG tablet Take 1 tablet (500 mg total) by mouth 3 (three) times daily for 10 days. 30 tablet 0  . loratadine (CLARITIN) 10 MG tablet Take 1 tablet (10 mg total) by mouth daily as needed for allergies. 90 tablet 2  . losartan (COZAAR) 100 MG tablet Take 0.5 tablets (50 mg total) by mouth daily. 45 tablet 3  . oxyCODONE-acetaminophen (PERCOCET) 10-325 MG tablet Take 1 tablet by mouth every 6 (six) hours as needed for pain. 20 tablet 0   No current facility-administered medications on file prior to visit.      Review of Systems  Constitutional: Negative for chills, diaphoresis  and fever.  HENT: Positive for ear discharge and ear pain. Negative for congestion, hearing loss, sinus pain, sore throat and tinnitus.   Eyes: Positive for photophobia and pain. Negative for blurred vision, double vision, discharge and redness.  Respiratory: Negative for cough and shortness of breath.   Cardiovascular: Negative for chest pain and palpitations.  Gastrointestinal: Positive for nausea. Negative for abdominal pain and diarrhea.  Musculoskeletal: Negative for joint pain.  Skin: Positive for rash.  Neurological: Positive for dizziness and headaches. Negative for tingling, sensory change, speech change, focal weakness and loss of consciousness.  Psychiatric/Behavioral: The patient is not nervous/anxious.       Observations/Objective: While the video is somewhat blurry patient looks to be in discomfort Rash with crust noted on R chin/ jaw (with swelling) and some lateral to R eye No involvement of the nose  Pt's wife is speaking for him Raymond West is dizzy and trying to stay still   Assessment and Plan: Problem List Items Addressed This Visit      Digestive   Dental abscess    R lower wisdom tooth Also shingles on R face Pt on clinda (tooth does not hurt) Has consult planned with oral surg on Friday (if he feels well enough to go) Tolerating clindamycin well Reviewed hospital records, lab results and studies in detail          Other   Shingles - Primary    R face (suspect V2 and V3, cannot tell if V1)  Seen in ED Reviewed hospital records, lab results and studies in detail   May have been encouraged by abscessed wisdom tooth on that side Currently on famvir and clindamycin  Percocet causes nausea Pt also having vertigo symptoms (from shingles/ ? If also from percocet)  Was able to see face (limited)on screen Disc way to care for rash (gentle soap and water) Was told by ED (who saw rash more clearly) - to see opthy (ref done now- no blurred vision but does have  photophobia) Sent in tramadol for pain to see if this is tolerated better Also meclizine for dizziness Warned of sedation for both (and habit for pain med)  His wife will email me tomorrow with an update and then we will plan a web ex f/u  No return to work date at this time        Relevant Orders   Ambulatory referral to Ophthalmology       Follow Up Instructions: Tramadol for pain with caution of sedation  Meclizine for dizziness-also caution of sedation  Clean rash with cool cloth/soap and water  Hold percocet Continue other medicines We will refer to eye doctor-call from [facility]  Also please email me tomorrow with update     I discussed the assessment and treatment plan with the patient. The patient was provided an opportunity to ask questions and all were answered. The patient agreed with the plan and demonstrated an understanding of the instructions.   The patient was advised to call back or seek an in-person evaluation if the symptoms worsen or if the condition fails to improve as anticipated.     Loura Pardon, MD .Roselie Awkward

## 2019-02-07 NOTE — Assessment & Plan Note (Signed)
R face (suspect V2 and V3, cannot tell if V1)  Seen in ED Reviewed hospital records, lab results and studies in detail   May have been encouraged by abscessed wisdom tooth on that side Currently on famvir and clindamycin  Percocet causes nausea Pt also having vertigo symptoms (from shingles/ ? If also from percocet)  Was able to see face (limited)on screen Disc way to care for rash (gentle soap and water) Was told by ED (who saw rash more clearly) - to see opthy (ref done now- no blurred vision but does have photophobia) Sent in tramadol for pain to see if this is tolerated better Also meclizine for dizziness Warned of sedation for both (and habit for pain med)  His wife will email me tomorrow with an update and then we will plan a web ex f/u  No return to work date at this time

## 2019-02-07 NOTE — Assessment & Plan Note (Signed)
R lower wisdom tooth Also shingles on R face Pt on clinda (tooth does not hurt) Has consult planned with oral surg on Friday (if he feels well enough to go) Tolerating clindamycin well Reviewed hospital records, lab results and studies in detail

## 2019-02-08 ENCOUNTER — Encounter: Payer: Self-pay | Admitting: Family Medicine

## 2019-02-13 ENCOUNTER — Other Ambulatory Visit: Payer: Self-pay

## 2019-02-13 ENCOUNTER — Encounter: Payer: Self-pay | Admitting: Family Medicine

## 2019-02-13 ENCOUNTER — Ambulatory Visit (INDEPENDENT_AMBULATORY_CARE_PROVIDER_SITE_OTHER): Payer: 59 | Admitting: Family Medicine

## 2019-02-13 VITALS — BP 124/80 | HR 80 | Temp 97.8°F | Ht 70.25 in | Wt 241.5 lb

## 2019-02-13 DIAGNOSIS — B0221 Postherpetic geniculate ganglionitis: Secondary | ICD-10-CM | POA: Insufficient documentation

## 2019-02-13 DIAGNOSIS — G510 Bell's palsy: Secondary | ICD-10-CM

## 2019-02-13 DIAGNOSIS — K047 Periapical abscess without sinus: Secondary | ICD-10-CM

## 2019-02-13 DIAGNOSIS — B028 Zoster with other complications: Secondary | ICD-10-CM

## 2019-02-13 MED ORDER — TRAMADOL HCL 50 MG PO TABS: 50.0000 mg | ORAL_TABLET | Freq: Four times a day (QID) | ORAL | 0 refills | Status: DC | PRN

## 2019-02-13 MED ORDER — GABAPENTIN 100 MG PO CAPS: 100.0000 mg | ORAL_CAPSULE | Freq: Two times a day (BID) | ORAL | 2 refills | Status: DC

## 2019-02-13 NOTE — Progress Notes (Signed)
Subjective:    Patient ID: Raymond West, male    DOB: 24-May-1976, 43 y.o.   MRN: [id number]  HPI  Here for f/u of zoster on face  Facial pain/tinnitus and ear pain persist  Also dizziness on and off /using meclizine   Wt Readings from Last 3 Encounters:  02/13/19 241 lb 8 oz (109.5 kg)  02/05/19 250 lb (113.4 kg)  08/26/18 254 lb 4 oz (115.3 kg)   Was tx with famcyclovir-still taking it  Tramadol for pain  Meclizine   Facial pain continues  Tramadol helps and it is improving a bit   Ear is ringing constantly  Hurts inside  Hard to hear out of  A little d/c from ear - scab/ ? Pus (not as bad as when he was in the ER)   Dizziness is coming and going  Movement worsens it / even a little   Done with the abx (for dental issue)-not emergency -told surgery can wait     Review of Systems  Constitutional: Positive for fatigue. Negative for activity change, appetite change, fever and unexpected weight change.  HENT: Negative for congestion, rhinorrhea, sore throat, trouble swallowing and voice change.   Eyes: Negative for pain, redness, itching and visual disturbance.       R eye feels dry/ more trouble closing it   Respiratory: Negative for cough, chest tightness, shortness of breath and wheezing.   Cardiovascular: Negative for chest pain and palpitations.  Gastrointestinal: Negative for abdominal pain, blood in stool, constipation, diarrhea and nausea.  Endocrine: Negative for cold intolerance, heat intolerance, polydipsia and polyuria.  Genitourinary: Negative for difficulty urinating, dysuria, frequency and urgency.  Musculoskeletal: Negative for arthralgias, joint swelling and myalgias.  Skin: Positive for rash. Negative for pallor.       Some small hives on legs  Shingles on R face  Neurological: Positive for dizziness and headaches. Negative for tremors, speech difficulty, weakness and numbness.  Hematological: Negative for adenopathy. Does not bruise/bleed easily.   Psychiatric/Behavioral: Negative for decreased concentration and dysphoric mood. The patient is not nervous/anxious.        Objective:   Physical Exam Constitutional:      General: He is not in acute distress.    Appearance: Normal appearance. He is obese.  HENT:     Head: Normocephalic and atraumatic.     Right Ear: Tympanic membrane, ear canal and external ear normal. There is no impacted cerumen.     Left Ear: Tympanic membrane, ear canal and external ear normal. There is no impacted cerumen.     Nose: Nose normal.     Mouth/Throat:     Mouth: Mucous membranes are moist.     Pharynx: Oropharynx is clear.  Eyes:     General: No visual field deficit or scleral icterus.    Extraocular Movements: Extraocular movements intact.     Conjunctiva/sclera: Conjunctivae normal.     Pupils: Pupils are equal, round, and reactive to light.     Comments: R eye does not fully close   Neck:     Musculoskeletal: Normal range of motion. No neck rigidity or muscular tenderness.     Vascular: No carotid bruit.  Cardiovascular:     Rate and Rhythm: Normal rate and regular rhythm.     Pulses: Normal pulses.     Heart sounds: Normal heart sounds.  Pulmonary:     Effort: Pulmonary effort is normal. No respiratory distress.     Breath sounds: Normal breath sounds.  No wheezing, rhonchi or rales.  Lymphadenopathy:     Cervical: Cervical adenopathy present.  Skin:    General: Skin is warm and dry.     Findings: Rash present.     Comments: R side of chin has old/healing vesicles with scabbing in beard / little to no erythema   Areas of small whelps bilateral thighs  No insect bites noted   Neurological:     Mental Status: He is alert.     Cranial Nerves: Cranial nerve deficit and facial asymmetry present.     Sensory: Sensation is intact.     Motor: Motor function is intact. No tremor, abnormal muscle tone or pronator drift.     Coordination: Coordination is intact. Coordination normal.      Gait: Gait normal.     Deep Tendon Reflexes: Reflexes normal.     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 Given recent dental abscess and current covid pandemic I am apprehensive to px prednisone at this time  He will finish famvir soon  Disc taping eye shut and using lubricating eye drops prn if needed  Will continue to monitor       Relevant Medications   gabapentin (NEURONTIN) 100 MG capsule     Other   Shingles - Primary    R lower face (happened after her developed dental abscess on that side)  Complicated now with symptoms of ramsay hunt syndrome / bells palsy Now experiencing ear pain with tinnitus and episodic positional vertigo  Pain and rash are improving Finishing famvir  Tramadol refilled for pain  Gabapentin px for post herpetic neuralgia  Noted findings related of Bells Palsy (new) today-suspect related to the virus  Apprehensive to use prednisone for several reasons  Continue meclizine prn dizziness  Rest/fluids inst to update frequently - can titrate gabapentin as needed

## 2019-02-13 NOTE — Assessment & Plan Note (Addendum)
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

## 2019-02-13 NOTE — Patient Instructions (Addendum)
Keep rash clean -soap and water  Ear and ear canal look ok  Dizziness and ringing will persist for a while  So will the bell's palsy   You may need to tape eye shut at night Use lubricating eye drops   Try gabapentin for additional pain (post herpetic neuralgia)  Let me know how it goes

## 2019-02-13 NOTE — Assessment & Plan Note (Addendum)
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

## 2019-02-13 NOTE — Assessment & Plan Note (Signed)
Per pt this is resolved with recent abx  He needs wisdom teeth pulled but waiting until shingles abates

## 2019-02-13 NOTE — Telephone Encounter (Signed)
Pt gave permission on phone to speak with pts wife; pt had virtual visit on 02/07/19 and was feeling better, shingles has scabbed over and pt was walking around outside on 02/10/19. On 02/11/19 vertigo reappeared and meclizine does not seem as effective as before. Rt ear is ringing and having problems breathing. No fever, cough,SOB; pt has not traveled or no known exposure to covid or flu.Dr Glori Bickers said to scheduled in office appt. Pt to see Dr Glori Bickers 04/06/20at 11:30. FYI to Dr Glori Bickers.

## 2019-02-15 ENCOUNTER — Telehealth: Payer: Self-pay | Admitting: Family Medicine

## 2019-02-15 MED ORDER — PREDNISONE 20 MG PO TABS: 60.0000 mg | ORAL_TABLET | Freq: Every day | ORAL | 0 refills | Status: DC

## 2019-02-15 NOTE — Telephone Encounter (Signed)
Principal faxed disability benefit forms In dr tower in box

## 2019-02-16 NOTE — Telephone Encounter (Signed)
Done I put a tentative return to work 4/29 but we can change as needed

## 2019-02-16 NOTE — Telephone Encounter (Signed)
Pt aware paperwork is ready for pickup he is aware there are parts that he needs to fill out before he turns this in   Copy for pt Copy for scan

## 2019-02-20 MED ORDER — PREDNISONE 20 MG PO TABS: ORAL_TABLET | ORAL | 0 refills | Status: DC

## 2019-02-20 MED ORDER — TRAMADOL HCL 50 MG PO TABS: 50.0000 mg | ORAL_TABLET | Freq: Four times a day (QID) | ORAL | 0 refills | Status: DC | PRN

## 2019-02-20 NOTE — Addendum Note (Signed)
Addended by: Loura Pardon A on: 02/20/2019 10:56 AM   Modules accepted: Orders

## 2019-02-27 MED ORDER — TRAMADOL HCL 50 MG PO TABS: 50.0000 mg | ORAL_TABLET | Freq: Four times a day (QID) | ORAL | 0 refills | Status: DC | PRN

## 2019-02-27 MED ORDER — MECLIZINE HCL 25 MG PO TABS: 25.0000 mg | ORAL_TABLET | Freq: Three times a day (TID) | ORAL | 1 refills | Status: DC | PRN

## 2019-02-27 NOTE — Addendum Note (Signed)
Addended by: Loura Pardon A on: 02/27/2019 10:06 AM   Modules accepted: Orders

## 2019-02-28 ENCOUNTER — Telehealth: Payer: Self-pay | Admitting: Family Medicine

## 2019-02-28 NOTE — Telephone Encounter (Signed)
Principal faxed medical update form to be filled out  In dr tower in box

## 2019-02-28 NOTE — Telephone Encounter (Signed)
Done and in IN box 

## 2019-03-02 NOTE — Telephone Encounter (Signed)
Got it and will work on finishing this up and returning on 03/03/2019

## 2019-03-03 NOTE — Telephone Encounter (Signed)
Sent pt my chart message letting him know paperwork has been faxed Copy for pt Copy for scan

## 2019-03-03 NOTE — Telephone Encounter (Signed)
Gave it to Robin to finish up. Thank you

## 2019-03-06 ENCOUNTER — Encounter: Payer: Self-pay | Admitting: Family Medicine

## 2019-03-06 ENCOUNTER — Other Ambulatory Visit: Payer: Self-pay

## 2019-03-06 ENCOUNTER — Ambulatory Visit (INDEPENDENT_AMBULATORY_CARE_PROVIDER_SITE_OTHER): Payer: 59 | Admitting: Family Medicine

## 2019-03-06 DIAGNOSIS — B0221 Postherpetic geniculate ganglionitis: Secondary | ICD-10-CM | POA: Diagnosis not present

## 2019-03-06 DIAGNOSIS — Z6835 Body mass index (BMI) 35.0-35.9, adult: Secondary | ICD-10-CM

## 2019-03-06 DIAGNOSIS — B0229 Other postherpetic nervous system involvement: Secondary | ICD-10-CM | POA: Diagnosis not present

## 2019-03-06 MED ORDER — GABAPENTIN 100 MG PO CAPS: 200.0000 mg | ORAL_CAPSULE | Freq: Two times a day (BID) | ORAL | 3 refills | Status: DC

## 2019-03-06 MED ORDER — TRAMADOL HCL 50 MG PO TABS: 50.0000 mg | ORAL_TABLET | Freq: Four times a day (QID) | ORAL | 0 refills | Status: DC | PRN

## 2019-03-06 NOTE — Progress Notes (Signed)
Virtual Visit via Video Note  I connected with Raymond West on 03/06/19 at 10:45 AM EDT by a video enabled telemedicine application and verified that I am speaking with the correct person using two identifiers. The patient is at home today I am in my office at [place]    I discussed the limitations of evaluation and management by telemedicine and the availability of in person appointments. The patient expressed understanding and agreed to proceed.  History of Present Illness: Presents for f/u of shingles with Bell's palsy and dizziness (Ramsay Hunt syndrome)   Trying to work around /outside the house - still getting quite dizzy  Droop looks better Still pain in his R cheek Can hear well now  Can close his eye   Still significantly dizzy Stumbles easily  Worse with movement and change in position  He has to catch himself  Better if he is still  Sleeping fairly good - improving  He is fearful to go back on ladders with this  Is driving just a little on his property-not outside of it (this made him dizzy as well)   Trying not to take meclizine unless he needs it -has 19 pills left   Good with prednisone on wed   Tramadol- takes 6 per day -weaning down   Review of Systems  Constitutional: Positive for malaise/fatigue. Negative for chills, diaphoresis, fever and weight loss.  HENT: Positive for ear pain and tinnitus. Negative for ear discharge, sinus pain and sore throat.   Eyes: Negative for blurred vision.  Respiratory: Negative for cough and shortness of breath.   Cardiovascular: Negative for chest pain and palpitations.  Gastrointestinal: Negative for nausea and vomiting.  Musculoskeletal: Negative for myalgias.  Skin: Positive for itching. Negative for rash.  Neurological: Positive for dizziness, tingling and headaches. Negative for focal weakness, seizures and loss of consciousness.  Psychiatric/Behavioral: Negative for depression.      Patient Active Problem List    Diagnosis Date Noted  . Ramsay Hunt syndrome (geniculate herpes zoster) 02/13/2019  . Post herpetic neuralgia 02/07/2019  . Dental abscess 02/07/2019  . H/O: pneumonia 03/18/2018  . Class 2 severe obesity due to excess calories with serious comorbidity and body mass index (BMI) of 35.0 to 35.9 in adult ([place]) 06/26/2016  . Routine general medical examination at a health care facility 05/27/2012  . Essential hypertension 01/15/2010   Past Medical History:  Diagnosis Date  . Neoplasm of uncertain behavior of skin   . Unspecified essential hypertension    No past surgical history on file. Social History   Tobacco Use  . Smoking status: Never Smoker  . Smokeless tobacco: Never Used  Substance Use Topics  . Alcohol use: Yes    Alcohol/week: 0.0 standard drinks    Comment: rarely  . Drug use: No   Family History  Problem Relation Age of Onset  . Leukemia Mother   . Diabetes Mother   . Kidney disease Father        has been on dialysis for years  . Diabetes Father        ? -unsure  . Arthritis Maternal Grandfather   . Arthritis Maternal Grandmother   . Arthritis Paternal Grandfather   . Arthritis Paternal Grandmother    Allergies  Allergen Reactions  . Amoxicillin     REACTION: rash  . Benazepril Cough  . Keflex [Cephalexin]     Rash   . Penicillins Rash   Current Outpatient Medications on File Prior to  Visit  Medication Sig Dispense Refill  . amLODipine (NORVASC) 5 MG tablet TAKE 1 TABLET BY MOUTH EVERY DAY 90 tablet 2  . loratadine (CLARITIN) 10 MG tablet Take 1 tablet (10 mg total) by mouth daily as needed for allergies. 90 tablet 2  . losartan (COZAAR) 100 MG tablet Take 0.5 tablets (50 mg total) by mouth daily. 45 tablet 3  . meclizine (ANTIVERT) 25 MG tablet Take 1 tablet (25 mg total) by mouth 3 (three) times daily as needed for dizziness. Caution of sedation 30 tablet 1  . predniSONE (DELTASONE) 20 MG tablet Take 3 pills once daily by mouth for 5 days then 2  pills once daily for 5 days then 1 pill daily for 5 days then stop 30 tablet 0   No current facility-administered medications on file prior to visit.     Observations/Objective: Pt appears closer to his usual self today  Facial droop is much improved and he is able to close his right eye  Affect is positive/cheerful  Not hoarse or sob with speech Not slurring  No cough  Rash on R face has been replaced by some mild erythema only (healed well)  Mentally sharp and answers questions appropriately   Assessment and Plan: Problem List Items Addressed This Visit      Nervous and Auditory   Post herpetic neuralgia    R face with Ramsay Hunt syndrome Now post herpetic neuralgia Also residual dizziness Slow improvement  I predict at least 2 more weeks before return to work -letter written and will update FMLA when it comes as well  He continues taking tramadol for pain about 6 per day (is trying to wean) We will increase gabapentin to 200 mg bid as tolerated with option to go up as needed as well  Dizziness is still an issue so using caution       Ramsay Hunt syndrome (geniculate herpes zoster) - Primary    Ramsay Hunt syndrome with zoster on face, now much improved  Facial droop is almost gone/he can close eye  Still having R sided facial pain and dizziness  Hearing has improved  Out of work 2 more weeks (primarily for positional dizziness as he often works on [employer] and is not safe to do so yet) He is finishing the prednisone (so immune status will improve as well) Will continue to update       Relevant Medications   gabapentin (NEURONTIN) 100 MG capsule     Other   Class 2 severe obesity due to excess calories with serious comorbidity and body mass index (BMI) of 35.0 to 35.9 in adult ([place])    Enc healthy diet  Is able to be more active as well as he feels better-but limited by dizziness          Follow Up Instructions: Increase gabapentin to 200 mg twice daily and  alert me if side effects-we have room to increase this later if needed for pain of post herpetic neuralgia  Keep weaning down on the tramadol as tolerated Take meclizine for dizziness as needed  Use caution /do not get on ladders  We will keep you out of work 2 more weeks due to pain and dizziness Stay safe and alert me if any changes     I discussed the assessment and treatment plan with the patient. The patient was provided an opportunity to ask questions and all were answered. The patient agreed with the plan and demonstrated an understanding of the  instructions.   The patient was advised to call back or seek an in-person evaluation if the symptoms worsen or if the condition fails to improve as anticipated.     Loura Pardon, MD

## 2019-03-06 NOTE — Assessment & Plan Note (Addendum)
R face with Ramsay Hunt syndrome Now post herpetic neuralgia Also residual dizziness Slow improvement  I predict at least 2 more weeks before return to work -letter written and will update FMLA when it comes as well  He continues taking tramadol for pain about 6 per day (is trying to wean) We will increase gabapentin to 200 mg bid as tolerated with option to go up as needed as well  Dizziness is still an issue so using caution

## 2019-03-06 NOTE — Assessment & Plan Note (Signed)
Enc healthy diet  Is able to be more active as well as he feels better-but limited by dizziness

## 2019-03-06 NOTE — Assessment & Plan Note (Signed)
Ramsay Hunt syndrome with zoster on face, now much improved  Facial droop is almost gone/he can close eye  Still having R sided facial pain and dizziness  Hearing has improved  Out of work 2 more weeks (primarily for positional dizziness as he often works on [employer] and is not safe to do so yet) He is finishing the prednisone (so immune status will improve as well) Will continue to update

## 2019-03-07 ENCOUNTER — Telehealth: Payer: Self-pay | Admitting: Family Medicine

## 2019-03-07 NOTE — Telephone Encounter (Signed)
Pt's wife dropped off Medical Update Form to be filled out for work.Gave ppw to [employer]

## 2019-03-07 NOTE — Telephone Encounter (Signed)
Left message asking Raymond West @ [employer] to call office office.  I faxed this paperwork 4/24 do they need additional information

## 2019-03-09 ENCOUNTER — Telehealth: Payer: Self-pay | Admitting: Family Medicine

## 2019-03-09 NOTE — Telephone Encounter (Signed)
Done and in the out box on my desk Thanks

## 2019-03-09 NOTE — Telephone Encounter (Signed)
Principal faxed additional paperwork  On cart for carrie to deliver to dr tower

## 2019-03-10 NOTE — Telephone Encounter (Signed)
Paperwork faxed °

## 2019-03-15 ENCOUNTER — Encounter: Payer: Self-pay | Admitting: Family Medicine

## 2019-03-21 NOTE — Telephone Encounter (Signed)
Copy for pt °Copy for scan °

## 2019-03-21 NOTE — Telephone Encounter (Signed)
Please let her know I printed the letter

## 2019-03-22 ENCOUNTER — Encounter: Payer: Self-pay | Admitting: Family Medicine

## 2019-03-29 ENCOUNTER — Ambulatory Visit (INDEPENDENT_AMBULATORY_CARE_PROVIDER_SITE_OTHER): Payer: 59 | Admitting: Family Medicine

## 2019-03-29 ENCOUNTER — Other Ambulatory Visit: Payer: Self-pay | Admitting: Family Medicine

## 2019-03-29 ENCOUNTER — Encounter: Payer: Self-pay | Admitting: Family Medicine

## 2019-03-29 DIAGNOSIS — J069 Acute upper respiratory infection, unspecified: Secondary | ICD-10-CM | POA: Diagnosis not present

## 2019-03-29 DIAGNOSIS — B9789 Other viral agents as the cause of diseases classified elsewhere: Secondary | ICD-10-CM | POA: Diagnosis not present

## 2019-03-29 MED ORDER — PROMETHAZINE-DM 6.25-15 MG/5ML PO SYRP: 5.0000 mL | ORAL_SOLUTION | Freq: Four times a day (QID) | ORAL | 0 refills | Status: DC | PRN

## 2019-03-29 MED ORDER — BENZONATATE 200 MG PO CAPS: 200.0000 mg | ORAL_CAPSULE | Freq: Three times a day (TID) | ORAL | 1 refills | Status: DC | PRN

## 2019-03-29 NOTE — Assessment & Plan Note (Signed)
Mildly productive cough and rhinorrhea No fever/malaise or other symptoms  Cannot r/o covid given respiratory nature of symptoms so needs to be out of work until resolved and no elevation in temp for 3 d He will check temp 1-2 times daily  Stay home/rest/isolate and drink fluids  Tessalon and prometh DM for cough prn with caution of sedation  Work note written-open ended so pt will call when better to be released back  Update if not starting to improve in a week or if worsening  -especially if elevated temp/sob/wheezing/headache or worse cough

## 2019-03-29 NOTE — Patient Instructions (Signed)
We have to keep you out of work until symptoms (cough) are better and you have no elevated temperature for 3 days in a row  Take your temp several times daily  Stay home/isolate and rest  Drink lots of fluids  Try the tessalon and the promethazine dm for cough (caution of sedation)  I released a work note to my chart-if you need it mailed to you please let me know  Call when you are better so I can release you to work   Update if not starting to improve in a week or if worsening  -especially worse cough or fever or shortness of breath

## 2019-03-29 NOTE — Progress Notes (Signed)
Virtual Visit via Video Note  I connected with Raymond West on 03/29/19 at  3:45 PM EDT by a video enabled telemedicine application and verified that I am speaking with the correct person using two identifiers.  Location: Patient: home Provider: office    I discussed the limitations of evaluation and management by telemedicine and the availability of in person appointments. The patient expressed understanding and agreed to proceed.  History of Present Illness: Started a cough on Thursday  occ productive of yellow phlegm  No fever  Runny and stuffy nose - blowing out yellow and clear d/c  No headache No facial pain  No ST (a little sore fri nt with cough)   No sob  No wheezing  No tight breathing   No rash  No tick bites   No n/v/d or abd pain   Has been working   Taking sudafed Delsym extra strength  Zyrtec at night  Did start with benadryl   When he exerts himself he coughs more   Ibuprofen for zoster pain bid -- irritated but much better   No one is sick at home   Feels fine overall   Review of Systems  Constitutional: Negative for chills, diaphoresis, fever, malaise/fatigue and weight loss.  HENT: Positive for congestion. Negative for ear discharge, ear pain and sinus pain.   Eyes: Negative for discharge and redness.  Respiratory: Positive for cough and sputum production. Negative for hemoptysis, shortness of breath, wheezing and stridor.   Cardiovascular: Negative for chest pain and palpitations.  Gastrointestinal: Negative for abdominal pain, diarrhea, heartburn, nausea and vomiting.  Musculoskeletal: Negative for myalgias.  Skin: Negative for itching and rash.  Neurological: Negative for dizziness and headaches.      Patient Active Problem List   Diagnosis Date Noted  . Viral URI with cough 03/29/2019  . Ramsay Hunt syndrome (geniculate herpes zoster) 02/13/2019  . Post herpetic neuralgia 02/07/2019  . Dental abscess 02/07/2019  . H/O: pneumonia  03/18/2018  . Class 2 severe obesity due to excess calories with serious comorbidity and body mass index (BMI) of 35.0 to 35.9 in adult ([place]) 06/26/2016  . Routine general medical examination at a health care facility 05/27/2012  . Essential hypertension 01/15/2010   Past Medical History:  Diagnosis Date  . Neoplasm of uncertain behavior of skin   . Unspecified essential hypertension    No past surgical history on file. Social History   Tobacco Use  . Smoking status: Never Smoker  . Smokeless tobacco: Never Used  Substance Use Topics  . Alcohol use: Yes    Alcohol/week: 0.0 standard drinks    Comment: rarely  . Drug use: No   Family History  Problem Relation Age of Onset  . Leukemia Mother   . Diabetes Mother   . Kidney disease Father        has been on dialysis for years  . Diabetes Father        ? -unsure  . Arthritis Maternal Grandfather   . Arthritis Maternal Grandmother   . Arthritis Paternal Grandfather   . Arthritis Paternal Grandmother    Allergies  Allergen Reactions  . Amoxicillin     REACTION: rash  . Benazepril Cough  . Keflex [Cephalexin]     Rash   . Penicillins Rash   Current Outpatient Medications on File Prior to Visit  Medication Sig Dispense Refill  . amLODipine (NORVASC) 5 MG tablet TAKE 1 TABLET BY MOUTH EVERY DAY 90 tablet 2  .  gabapentin (NEURONTIN) 100 MG capsule Take 2 capsules (200 mg total) by mouth 2 (two) times daily. 120 capsule 3  . loratadine (CLARITIN) 10 MG tablet Take 1 tablet (10 mg total) by mouth daily as needed for allergies. 90 tablet 2  . losartan (COZAAR) 100 MG tablet Take 0.5 tablets (50 mg total) by mouth daily. 45 tablet 3  . traMADol (ULTRAM) 50 MG tablet Take 1-2 tablets (50-100 mg total) by mouth every 6 (six) hours as needed. 40 tablet 0   No current facility-administered medications on file prior to visit.     Observations/Objective: Patient appears well, in no distress Weight is baseline  No facial swelling  or asymmetry Normal voice-not hoarse and no slurred speech No obvious tremor or mobility impairment Moving neck and UEs normally Able to hear the call well  No wheezing or shortness of breath during interview  Mild cough once. Talkative and mentally sharp with no cognitive changes No skin changes on face or neck , no rash or pallor Affect is normal    Assessment and Plan: Problem List Items Addressed This Visit      Respiratory   Viral URI with cough    Mildly productive cough and rhinorrhea No fever/malaise or other symptoms  Cannot r/o covid given respiratory nature of symptoms so needs to be out of work until resolved and no elevation in temp for 3 d He will check temp 1-2 times daily  Stay home/rest/isolate and drink fluids  Tessalon and prometh DM for cough prn with caution of sedation  Work note written-open ended so pt will call when better to be released back  Update if not starting to improve in a week or if worsening  -especially if elevated temp/sob/wheezing/headache or worse cough           Follow Up Instructions: We have to keep you out of work until symptoms (cough) are better and you have no elevated temperature for 3 days in a row  Take your temp several times daily  Stay home/isolate and rest  Drink lots of fluids  Try the tessalon and the promethazine dm for cough (caution of sedation)  I released a work note to my chart-if you need it mailed to you please let me know  Call when you are better so I can release you to work   Update if not starting to improve in a week or if worsening  -especially worse cough or fever or shortness of breath    I discussed the assessment and treatment plan with the patient. The patient was provided an opportunity to ask questions and all were answered. The patient agreed with the plan and demonstrated an understanding of the instructions.   The patient was advised to call back or seek an in-person evaluation if the symptoms  worsen or if the condition fails to improve as anticipated   Loura Pardon, MD

## 2019-03-30 NOTE — Telephone Encounter (Signed)
Received request from pharmacy asking Rx to be changed to a 90 day Rx

## 2019-03-31 ENCOUNTER — Encounter: Payer: Self-pay | Admitting: Family Medicine

## 2019-03-31 ENCOUNTER — Telehealth: Payer: Self-pay | Admitting: Family Medicine

## 2019-03-31 NOTE — Telephone Encounter (Signed)
Dr. Glori Bickers see pt's response via his mychart message

## 2019-03-31 NOTE — Telephone Encounter (Signed)
Per Covid-19 protocol we need to f/u with pt.  I sent pt a mychart message asking how is he doing an requested him to send [place] an update

## 2019-03-31 NOTE — Telephone Encounter (Signed)
That sounds good

## 2019-04-04 ENCOUNTER — Encounter: Payer: Self-pay | Admitting: Family Medicine

## 2019-07-20 ENCOUNTER — Ambulatory Visit (INDEPENDENT_AMBULATORY_CARE_PROVIDER_SITE_OTHER): Payer: 59 | Admitting: Family Medicine

## 2019-07-20 ENCOUNTER — Encounter: Payer: Self-pay | Admitting: Family Medicine

## 2019-07-20 DIAGNOSIS — L723 Sebaceous cyst: Secondary | ICD-10-CM | POA: Diagnosis not present

## 2019-07-20 MED ORDER — DOXYCYCLINE HYCLATE 100 MG PO TABS: 100.0000 mg | ORAL_TABLET | Freq: Two times a day (BID) | ORAL | 0 refills | Status: DC

## 2019-07-20 NOTE — Assessment & Plan Note (Signed)
Red lump of R axilla that is tender/not draining  Suspect infected cyst Cover with doxycycline  Warm compresses  Had a tick bite on R elbow recently-gone now  Swollen LN is in the differential but less likely  Will f/u if no imp   Update if not starting to improve in a week or if worsening

## 2019-07-20 NOTE — Progress Notes (Signed)
Virtual Visit via Video Note  I connected with Raymond West on 07/20/19 at 10:15 AM EDT by a video enabled telemedicine application and verified that I am speaking with the correct person using two identifiers.  Location: Patient: home Provider: office    I discussed the limitations of evaluation and management by telemedicine and the availability of in person appointments. The patient expressed understanding and agreed to proceed.  History of Present Illness: Knot under L arm (in armpit) Noticed some burning in that area in the middle of the night  Next day it still hurt-noticed redness and swelling  No head to it  No drainage   Not tender unless he pushes in on it  About 2 in in diameter   had a tick bite on L elbow last week  Small red spot quickly resolved/no bullseye pattern  No fever Does not feel sick  Patient Active Problem List   Diagnosis Date Noted  . Sebaceous cyst of right axilla 07/20/2019  . Viral URI with cough 03/29/2019  . Ramsay Hunt syndrome (geniculate herpes zoster) 02/13/2019  . Post herpetic neuralgia 02/07/2019  . Dental abscess 02/07/2019  . H/O: pneumonia 03/18/2018  . Class 2 severe obesity due to excess calories with serious comorbidity and body mass index (BMI) of 35.0 to 35.9 in adult ([place]) 06/26/2016  . Routine general medical examination at a health care facility 05/27/2012  . Essential hypertension 01/15/2010   Past Medical History:  Diagnosis Date  . Neoplasm of uncertain behavior of skin   . Unspecified essential hypertension    No past surgical history on file. Social History   Tobacco Use  . Smoking status: Never Smoker  . Smokeless tobacco: Never Used  Substance Use Topics  . Alcohol use: Yes    Alcohol/week: 0.0 standard drinks    Comment: rarely  . Drug use: No   Family History  Problem Relation Age of Onset  . Leukemia Mother   . Diabetes Mother   . Kidney disease Father        has been on dialysis for years  .  Diabetes Father        ? -unsure  . Arthritis Maternal Grandfather   . Arthritis Maternal Grandmother   . Arthritis Paternal Grandfather   . Arthritis Paternal Grandmother    Allergies  Allergen Reactions  . Amoxicillin     REACTION: rash  . Benazepril Cough  . Keflex [Cephalexin]     Rash   . Penicillins Rash   Current Outpatient Medications on File Prior to Visit  Medication Sig Dispense Refill  . amLODipine (NORVASC) 5 MG tablet TAKE 1 TABLET BY MOUTH EVERY DAY 90 tablet 2  . benzonatate (TESSALON) 200 MG capsule Take 1 capsule (200 mg total) by mouth 3 (three) times daily as needed for cough. Swallow whole, do not bite pill 30 capsule 1  . gabapentin (NEURONTIN) 100 MG capsule TAKE 2 CAPSULES (200 MG TOTAL) BY MOUTH 2 (TWO) TIMES DAILY. 360 capsule 1  . loratadine (CLARITIN) 10 MG tablet Take 1 tablet (10 mg total) by mouth daily as needed for allergies. 90 tablet 2  . losartan (COZAAR) 100 MG tablet Take 0.5 tablets (50 mg total) by mouth daily. 45 tablet 3  . promethazine-dextromethorphan (PROMETHAZINE-DM) 6.25-15 MG/5ML syrup Take 5 mLs by mouth 4 (four) times daily as needed for cough. Caution of sedation 140 mL 0  . traMADol (ULTRAM) 50 MG tablet Take 1-2 tablets (50-100 mg total) by mouth every 6 (  six) hours as needed. 40 tablet 0   No current facility-administered medications on file prior to visit.    Review of Systems  Constitutional: Negative for chills, diaphoresis, fever, malaise/fatigue and weight loss.  HENT: Negative for sore throat.   Respiratory: Negative for cough and shortness of breath.   Cardiovascular: Negative for chest pain and palpitations.  Gastrointestinal: Negative for abdominal pain, nausea and vomiting.  Skin: Negative for rash.  Neurological: Negative for dizziness.    Observations/Objective: Patient appears well, in no distress Weight is baseline  (obese) No facial swelling or asymmetry Normal voice-not hoarse and no slurred speech No  obvious tremor or mobility impairment Moving neck and UEs normally Able to hear the call well  No cough or shortness of breath during interview  Talkative and mentally sharp with no cognitive changes No skin changes on face or neck , no rash or pallor Small area of erythema (per pt palpation induration) R axillae (per pt not fluctuant and mildly tender)  No scab or drainage  Affect is normal    Assessment and Plan: Problem List Items Addressed This Visit      Musculoskeletal and Integument   Sebaceous cyst of right axilla    Red lump of R axilla that is tender/not draining  Suspect infected cyst Cover with doxycycline  Warm compresses  Had a tick bite on R elbow recently-gone now  Swollen LN is in the differential but less likely  Will f/u if no imp   Update if not starting to improve in a week or if worsening            Follow Up Instructions: Take the doxycycline as directed Use warm compress regularly to affected area  Keep clean with soap and water  Encourage drainage if it occurs  Call if any increase in redness/size/pain or if not improved in a week     I discussed the assessment and treatment plan with the patient. The patient was provided an opportunity to ask questions and all were answered. The patient agreed with the plan and demonstrated an understanding of the instructions.   The patient was advised to call back or seek an in-person evaluation if the symptoms worsen or if the condition fails to improve as anticipated.    Loura Pardon, MD

## 2019-07-20 NOTE — Patient Instructions (Signed)
Take the doxycycline as directed Use warm compress regularly to affected area  Keep clean with soap and water  Encourage drainage if it occurs  Call if any increase in redness/size/pain or if not improved in a week

## 2019-09-01 ENCOUNTER — Encounter: Payer: BLUE CROSS/BLUE SHIELD | Admitting: Family Medicine

## 2019-09-19 ENCOUNTER — Ambulatory Visit (INDEPENDENT_AMBULATORY_CARE_PROVIDER_SITE_OTHER): Payer: 59 | Admitting: Family Medicine

## 2019-09-19 ENCOUNTER — Encounter: Payer: Self-pay | Admitting: Family Medicine

## 2019-09-19 ENCOUNTER — Other Ambulatory Visit: Payer: Self-pay

## 2019-09-19 VITALS — BP 126/74 | HR 78 | Temp 97.4°F | Ht 70.0 in | Wt 259.4 lb

## 2019-09-19 DIAGNOSIS — I10 Essential (primary) hypertension: Secondary | ICD-10-CM

## 2019-09-19 DIAGNOSIS — Z0000 Encounter for general adult medical examination without abnormal findings: Secondary | ICD-10-CM | POA: Diagnosis not present

## 2019-09-19 DIAGNOSIS — Z23 Encounter for immunization: Secondary | ICD-10-CM | POA: Diagnosis not present

## 2019-09-19 DIAGNOSIS — Z6837 Body mass index (BMI) 37.0-37.9, adult: Secondary | ICD-10-CM

## 2019-09-19 MED ORDER — LOSARTAN POTASSIUM 100 MG PO TABS: 50.0000 mg | ORAL_TABLET | Freq: Every day | ORAL | 3 refills | Status: DC

## 2019-09-19 MED ORDER — AMLODIPINE BESYLATE 5 MG PO TABS: ORAL_TABLET | ORAL | 3 refills | Status: DC

## 2019-09-19 NOTE — Patient Instructions (Addendum)
For cholesterol Avoid red meat/ fried foods/ egg yolks/ fatty breakfast meats/ butter, cheese and high fat dairy/ and shellfish    For weight loss  Try to get most of your carbohydrates from produce (with the exception of white potatoes)  Eat less bread/pasta/rice/snack foods/cereals/sweets and other items from the middle of the grocery store (processed carbs) Eat more vegetables/ salads    Stay active  Use sunscreen to prevent skin cancer

## 2019-09-19 NOTE — Progress Notes (Signed)
Subjective:    Patient ID: Raymond West, male    DOB: Jan 11, 1976, 43 y.o.   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 He eats 2 meals per day  37.22 kg/m   occ fast food  Had biscuit today- unusual for him  occ sweets -not a lot   His father passed in June - he was on dialysis for years  Also heart failure (?) never told family  Thinks he is doing well with grief    Td 9/11  HIV screen neg 8/16  pna 23 vaccine 10/19   bp is stable today  No cp or palpitations or headaches or edema  No side effects to medicines  BP Readings from Last 3 Encounters:  09/19/19 126/74  02/13/19 124/80  02/05/19 (!) 133/91    Taking  Amlodipine 5 mg  Losartan 100 mg  No problems    Had shingles with ramsay Hunt syndrome this year Much improved  Still staggers around occ- balance is not quite 100% Off gabapentin and tramadol entirely    Lab Results  Component Value Date   CREATININE 0.94 02/05/2019   BUN 15 02/05/2019   NA 136 02/05/2019   K 3.8 02/05/2019   CL 101 02/05/2019   CO2 26 02/05/2019   Lab Results  Component Value Date   CHOL 193 08/26/2018   HDL 38 (L) 08/26/2018   LDLCALC 136 (H) 08/26/2018   LDLDIRECT 118.5 05/29/2013   TRIG 93 08/26/2018   CHOLHDL 5.1 (H) 08/26/2018   Due for labs  Some fast food/not that often   Patient Active Problem List   Diagnosis Date Noted  . Ramsay Hunt syndrome (geniculate herpes zoster) 02/13/2019  . H/O: pneumonia 03/18/2018  . Class 2 obesity due to excess calories with body mass index (BMI) of 37.0 to 37.9 in adult  06/26/2016  . Routine general medical examination at a health care facility 05/27/2012  . Essential hypertension 01/15/2010   Past Medical History:  Diagnosis Date  . Neoplasm of uncertain behavior of skin   . Unspecified essential hypertension    History reviewed. No pertinent surgical history. Social History   Tobacco Use  . Smoking status: Never Smoker  . Smokeless tobacco: Never Used  Substance Use Topics  . Alcohol use: Yes    Alcohol/week: 0.0 standard drinks    Comment: rarely  . Drug use: No   Family History  Problem Relation Age of Onset  . Leukemia Mother   . Diabetes Mother   . Kidney disease Father        has been on dialysis for years  . Diabetes Father        ? -unsure  . Arthritis Maternal Grandfather   . Arthritis Maternal Grandmother   . Arthritis Paternal Grandfather   . Arthritis Paternal Grandmother    Allergies  Allergen Reactions  . Amoxicillin     REACTION: rash  . Benazepril Cough  . Keflex [Cephalexin]     Rash   .  Penicillins Rash   Current Outpatient Medications on File Prior to Visit  Medication Sig Dispense Refill  . loratadine (CLARITIN) 10 MG tablet Take 1 tablet (10 mg total) by mouth daily as needed for allergies. 90 tablet 2   No current facility-administered medications on file prior to visit.     Review of Systems  Constitutional: Negative for activity change, appetite change, fatigue, fever and unexpected weight change.  HENT: Negative for congestion, rhinorrhea, sore throat and trouble swallowing.   Eyes: Negative for pain, redness, itching and visual disturbance.  Respiratory: Negative for cough, chest tightness, shortness of breath and wheezing.   Cardiovascular: Negative for chest pain and palpitations.  Gastrointestinal: Negative for abdominal pain, blood in stool, constipation, diarrhea and nausea.  Endocrine: Negative for cold intolerance, heat intolerance, polydipsia and polyuria.  Genitourinary: Negative for  difficulty urinating, dysuria, frequency and urgency.  Musculoskeletal: Negative for arthralgias, joint swelling and myalgias.  Skin: Negative for pallor and rash.  Neurological: Negative for dizziness, tremors, weakness, numbness and headaches.       Occ looses balance ever since he had zoster  Hematological: Negative for adenopathy. Does not bruise/bleed easily.  Psychiatric/Behavioral: Negative for decreased concentration and dysphoric mood. The patient is not nervous/anxious.        Objective:   Physical Exam Constitutional:      General: He is not in acute distress.    Appearance: Normal appearance. He is well-developed. He is obese. He is not ill-appearing or diaphoretic.  HENT:     Head: Normocephalic and atraumatic.     Right Ear: Tympanic membrane, ear canal and external ear normal.     Left Ear: Tympanic membrane, ear canal and external ear normal.     Nose: Nose normal. No congestion.     Mouth/Throat:     Mouth: Mucous membranes are moist.     Pharynx: Oropharynx is clear. No posterior oropharyngeal erythema.  Eyes:     General: No scleral icterus.       Right eye: No discharge.        Left eye: No discharge.     Conjunctiva/sclera: Conjunctivae normal.     Pupils: Pupils are equal, round, and reactive to light.  Neck:     Musculoskeletal: Normal range of motion and neck supple. No neck rigidity or muscular tenderness.     Thyroid: No thyromegaly.     Vascular: No carotid bruit or JVD.  Cardiovascular:     Rate and Rhythm: Normal rate and regular rhythm.     Pulses: Normal pulses.     Heart sounds: Normal heart sounds. No gallop.   Pulmonary:     Effort: Pulmonary effort is normal. No respiratory distress.     Breath sounds: Normal breath sounds. No wheezing or rales.     Comments: Good air exch Chest:     Chest wall: No tenderness.  Abdominal:     General: Bowel sounds are normal. There is no distension or abdominal bruit.     Palpations: Abdomen is soft.  There is no mass.     Tenderness: There is no abdominal tenderness.     Hernia: No hernia is present.  Musculoskeletal:        General: No tenderness.     Right lower leg: No edema.     Left lower leg: No edema.  Lymphadenopathy:     Cervical: No cervical adenopathy.  Skin:    General: Skin is warm and dry.     Coloration:  Skin is not pale.     Findings: No erythema or rash.     Comments: Ruddy complexion  Some lentigines  Neurological:     Mental Status: He is alert.     Cranial Nerves: No cranial nerve deficit.     Sensory: No sensory deficit.     Motor: No abnormal muscle tone.     Coordination: Coordination normal.     Gait: Gait normal.     Deep Tendon Reflexes: Reflexes are normal and symmetric. Reflexes normal.  Psychiatric:        Mood and Affect: Mood normal.        Cognition and Memory: Cognition and memory normal.           Assessment & Plan:   Problem List Items Addressed This Visit      Cardiovascular and Mediastinum   Essential hypertension    bp in fair control at this time  BP Readings from Last 1 Encounters:  09/19/19 126/74   No changes needed Most recent labs reviewed  Disc lifstyle change with low sodium diet and exercise  Labs today      Relevant Medications   losartan (COZAAR) 100 MG tablet   amLODipine (NORVASC) 5 MG tablet   Other Relevant Orders   CBC w/Diff   Comprehensive metabolic panel   Lipid panel   TSH     Other   Routine general medical examination at a health care facility - Primary    Reviewed health habits including diet and exercise and skin cancer prevention Reviewed appropriate screening tests for age  Also reviewed health mt list, fam hx and immunization status , as well as social and family history   See HPI Labs today Flu shot today  pna vaccine utd  Enc healthy diet/exercise for wt loss        Class 2 obesity due to excess calories with body mass index (BMI) of 37.0 to 37.9 in adult    Wt gain noted  Discussed how this problem influences overall health and the risks it imposes  Reviewed plan for weight loss with lower calorie diet (via better food choices and also portion control or program like weight watchers) and exercise building up to or more than 30 minutes 5 days per week including some aerobic activity          Other Visit Diagnoses    Need for influenza vaccination       Relevant Orders   Flu Vaccine QUAD 6+ mos PF IM (Fluarix Quad PF) (Completed)

## 2019-09-19 NOTE — Assessment & Plan Note (Signed)
Wt gain noted Discussed how this problem influences overall health and the risks it imposes  Reviewed plan for weight loss with lower calorie diet (via better food choices and also portion control or program like weight watchers) and exercise building up to or more than 30 minutes 5 days per week including some aerobic activity

## 2019-09-19 NOTE — Assessment & Plan Note (Signed)
Reviewed health habits including diet and exercise and skin cancer prevention Reviewed appropriate screening tests for age  Also reviewed health mt list, fam hx and immunization status , as well as social and family history   See HPI Labs today Flu shot today  pna vaccine utd  Enc healthy diet/exercise for wt loss

## 2019-09-19 NOTE — Assessment & Plan Note (Signed)
bp in fair control at this time  BP Readings from Last 1 Encounters:  09/19/19 126/74   No changes needed Most recent labs reviewed  Disc lifstyle change with low sodium diet and exercise  Labs today

## 2019-09-20 ENCOUNTER — Encounter: Payer: Self-pay | Admitting: Family Medicine

## 2019-09-20 LAB — CBC WITH DIFFERENTIAL/PLATELET
Basophils Absolute: 0.1 10*3/uL (ref 0.0–0.1)
Basophils Relative: 0.6 % (ref 0.0–3.0)
Eosinophils Absolute: 0.2 10*3/uL (ref 0.0–0.7)
Eosinophils Relative: 1.4 % (ref 0.0–5.0)
HCT: 44 % (ref 39.0–52.0)
Hemoglobin: 14.7 g/dL (ref 13.0–17.0)
Lymphocytes Relative: 31.9 % (ref 12.0–46.0)
Lymphs Abs: 3.5 10*3/uL (ref 0.7–4.0)
MCHC: 33.5 g/dL (ref 30.0–36.0)
MCV: 86.9 fl (ref 78.0–100.0)
Monocytes Absolute: 0.6 10*3/uL (ref 0.1–1.0)
Monocytes Relative: 5.8 % (ref 3.0–12.0)
Neutro Abs: 6.6 10*3/uL (ref 1.4–7.7)
Neutrophils Relative %: 60.3 % (ref 43.0–77.0)
Platelets: 229 10*3/uL (ref 150.0–400.0)
RBC: 5.07 Mil/uL (ref 4.22–5.81)
RDW: 13.6 % (ref 11.5–15.5)
WBC: 10.9 10*3/uL — ABNORMAL HIGH (ref 4.0–10.5)

## 2019-09-20 LAB — COMPREHENSIVE METABOLIC PANEL
ALT: 33 U/L (ref 0–53)
AST: 25 U/L (ref 0–37)
Albumin: 4.8 g/dL (ref 3.5–5.2)
Alkaline Phosphatase: 60 U/L (ref 39–117)
BUN: 20 mg/dL (ref 6–23)
CO2: 25 mEq/L (ref 19–32)
Calcium: 9.5 mg/dL (ref 8.4–10.5)
Chloride: 105 mEq/L (ref 96–112)
Creatinine, Ser: 1 mg/dL (ref 0.40–1.50)
GFR: 81.28 mL/min (ref >60.00)
Glucose, Bld: 96 mg/dL (ref 70–99)
Potassium: 4 mEq/L (ref 3.5–5.1)
Sodium: 140 mEq/L (ref 135–145)
Total Bilirubin: 0.7 mg/dL (ref 0.2–1.2)
Total Protein: 7.1 g/dL (ref 6.0–8.3)

## 2019-09-20 LAB — LIPID PANEL
Cholesterol: 179 mg/dL (ref 0–200)
HDL: 39.6 mg/dL (ref >39.00)
LDL Cholesterol: 104 mg/dL — ABNORMAL HIGH (ref 0–99)
NonHDL: 139.08
Total CHOL/HDL Ratio: 5
Triglycerides: 175 mg/dL — ABNORMAL HIGH (ref 0.0–149.0)
VLDL: 35 mg/dL (ref 0.0–40.0)

## 2019-09-20 LAB — TSH: TSH: 2.39 u[IU]/mL (ref 0.35–4.50)

## 2019-10-26 ENCOUNTER — Telehealth: Payer: Self-pay | Admitting: Family Medicine

## 2019-10-26 NOTE — Telephone Encounter (Signed)
Pt states that he got a bill from [facility] and was advised to call the office.  [employer] told him the Dx code used was not for a normal CPE. He states that he has never received a bill for his Physical and is not sure what was put in this time that kick it out of being covered. I asked him he knew what code specifically was causing it to be kicked out and he said that he did not understand all this stuff and that we may need to contact [facility]. He does not have the number with him at this time to call, it Is on the bill at home.  He requested a call back from [facility].

## 2019-10-27 NOTE — Telephone Encounter (Signed)
[facility] would not be the person to discuss billing. I am forwarding to [facility].

## 2019-10-30 NOTE — Telephone Encounter (Signed)
Spoke with the patient to let him know bill he received is for lab work. The amount was the co-insurance. Patient understood.

## 2019-11-02 ENCOUNTER — Encounter: Payer: Self-pay | Admitting: Family Medicine

## 2019-11-02 ENCOUNTER — Other Ambulatory Visit: Payer: Self-pay

## 2019-11-02 ENCOUNTER — Ambulatory Visit (INDEPENDENT_AMBULATORY_CARE_PROVIDER_SITE_OTHER): Payer: 59 | Admitting: Family Medicine

## 2019-11-02 VITALS — BP 120/74 | HR 88 | Temp 98.0°F | Ht 70.0 in | Wt 264.3 lb

## 2019-11-02 DIAGNOSIS — H60332 Swimmer's ear, left ear: Secondary | ICD-10-CM | POA: Diagnosis not present

## 2019-11-02 DIAGNOSIS — Z23 Encounter for immunization: Secondary | ICD-10-CM | POA: Diagnosis not present

## 2019-11-02 DIAGNOSIS — H6092 Unspecified otitis externa, left ear: Secondary | ICD-10-CM | POA: Insufficient documentation

## 2019-11-02 DIAGNOSIS — S61031A Puncture wound without foreign body of right thumb without damage to nail, initial encounter: Secondary | ICD-10-CM | POA: Diagnosis not present

## 2019-11-02 MED ORDER — DOXYCYCLINE HYCLATE 100 MG PO TABS: 100.0000 mg | ORAL_TABLET | Freq: Two times a day (BID) | ORAL | 0 refills | Status: DC

## 2019-11-02 MED ORDER — CIPROFLOXACIN-DEXAMETHASONE 0.3-0.1 % OT SUSP: 4.0000 [drp] | Freq: Two times a day (BID) | OTIC | 0 refills | Status: DC

## 2019-11-02 NOTE — Progress Notes (Signed)
This visit was conducted in person.  BP 120/74 (BP Location: Left Arm, Patient Position: Sitting, Cuff Size: Large)   Pulse 88   Temp 98 F (36.7 C) (Temporal)   Ht 5\' 10"  (1.778 m)   Wt 264 lb 5 oz (119.9 kg)   SpO2 97%   BMI 37.92 kg/m    CC: L ear pain Subjective:    Patient ID: Raymond West, male    DOB: December 27, 1975, 43 y.o.   MRN: [id number]  HPI: Raymond West is a 43 y.o. male presenting on 11/02/2019 for Ear Pain (C/o left ear pain and drainage.  Also, c/o HA on left side.  Tried Tylenol. ) and Finger Injury (Punctured right thumb at work yesterday with a piece of wire.  Last Td, 08/04/2010. )   1 wk h/o L ear pain, swelling, drainage. Pain progressive today into forehead and face. Mild nasal congestion. Muffled hearing of L ear.  No fevers/chills, cough, nausea, tinnitus, neck pain.  Took tylenol this morning for pain.  No recent swimming.   Injured R thumb at work yesterday with a piece of wire used for [employer] gun. Penetrating injury wife went through tip of thumb and out of radial side of finger at nail bed. No streaking redness or draining pus. Finger sore but not too bad. Cleaned with H2O2 and washed hand with soapy water.  Td 07/2010.      Relevant past medical, surgical, family and social history reviewed and updated as indicated. Interim medical history since our last visit reviewed. Allergies and medications reviewed and updated. Outpatient Medications Prior to Visit  Medication Sig Dispense Refill  . amLODipine (NORVASC) 5 MG tablet TAKE 1 TABLET BY MOUTH EVERY DAY 90 tablet 3  . loratadine (CLARITIN) 10 MG tablet Take 1 tablet (10 mg total) by mouth daily as needed for allergies. 90 tablet 2  . losartan (COZAAR) 100 MG tablet Take 0.5 tablets (50 mg total) by mouth daily. 45 tablet 3   No facility-administered medications prior to visit.     Per HPI unless specifically indicated in ROS section below Review of Systems Objective:    BP 120/74 (BP Location:  Left Arm, Patient Position: Sitting, Cuff Size: Large)   Pulse 88   Temp 98 F (36.7 C) (Temporal)   Ht 5\' 10"  (1.778 m)   Wt 264 lb 5 oz (119.9 kg)   SpO2 97%   BMI 37.92 kg/m   Wt Readings from Last 3 Encounters:  11/02/19 264 lb 5 oz (119.9 kg)  09/19/19 259 lb 6 oz (117.7 kg)  02/13/19 241 lb 8 oz (109.5 kg)    Physical Exam Vitals and nursing note reviewed.  Constitutional:      General: He is not in acute distress.    Appearance: Normal appearance. He is obese. He is not ill-appearing.  HENT:     Head: Normocephalic and atraumatic.     Right Ear: Tympanic membrane, ear canal and external ear normal. There is no impacted cerumen.     Left Ear: Decreased hearing noted. Drainage, swelling and tenderness present. There is no impacted cerumen.     Ears:     Comments: Tender left external ear edema and ear canal edema with yellow drainage present. Able to visualize small portion of TM, pearly grey  Eyes:     Extraocular Movements: Extraocular movements intact.     Pupils: Pupils are equal, round, and reactive to light.  Musculoskeletal:       Hands:  Comments:  2+ rad pulses bilaterally. Sensation intact.  Puncture wound to tip of anterior R thumb with exit mark lateral radial tip of finger near edge of nail without surrounding erythema or significant discomfort, no drainage from wound  Skin:    General: Skin is warm and dry.     Findings: No erythema.  Neurological:     Mental Status: He is alert.  Psychiatric:        Mood and Affect: Mood normal.        Behavior: Behavior normal.       Lab Results  Component Value Date   CREATININE 1.00 09/19/2019   BUN 20 09/19/2019   NA 140 09/19/2019   K 4.0 09/19/2019   CL 105 09/19/2019   CO2 25 09/19/2019    Assessment & Plan:  This visit occurred during the SARS-CoV-2 public health emergency.  Safety protocols were in place, including screening questions prior to the visit, additional usage of staff PPE, and  extensive cleaning of exam room while observing appropriate contact time as indicated for disinfecting solutions.   Problem List Items Addressed This Visit    Puncture wound of right thumb    Update Tdap today.  No signs of infection at this time.  He did have dirty metal wire puncture skin - will cover empirically with abx course. In PCN and keflex allergy (rash), will treat with doxycycline.  Update if not improving with treatment.       External otitis of left ear - Primary    Treat with ciprodex drops sent to pharmacy. rec ibuprofen PRN pain/swelling. Update if not improving with treatment. Red flags to seek urgent care over weekend reviewed.        Other Visit Diagnoses    Need for Tdap vaccination       Relevant Orders   Tdap vaccine greater than or equal to 7yo IM       Meds ordered this encounter  Medications  . doxycycline (VIBRA-TABS) 100 MG tablet    Sig: Take 1 tablet (100 mg total) by mouth 2 (two) times daily.    Dispense:  20 tablet    Refill:  0  . ciprofloxacin-dexamethasone (CIPRODEX) OTIC suspension    Sig: Place 4 drops into the left ear 2 (two) times daily.    Dispense:  7.5 mL    Refill:  0   Orders Placed This Encounter  Procedures  . Tdap vaccine greater than or equal to 7yo IM   Patient instructions: Tdap today You have external ear infection - treat with antibiotic sent to pharmacy This will also cover recent puncture wound to right thumb.  Let us know if not improving each day.  If fever, worsening pain/swelling at ear, seek urgent care evaluation.  Take ibuprofen 600mg  with meals for ear pain/inflammation.   Follow up plan: No follow-ups on file.  Ria Bush, MD

## 2019-11-02 NOTE — Assessment & Plan Note (Signed)
Update Tdap today.  No signs of infection at this time.  He did have dirty metal wire puncture skin - will cover empirically with abx course. In PCN and keflex allergy (rash), will treat with doxycycline.  Update if not improving with treatment.

## 2019-11-02 NOTE — Assessment & Plan Note (Signed)
Treat with ciprodex drops sent to pharmacy. rec ibuprofen PRN pain/swelling. Update if not improving with treatment. Red flags to seek urgent care over weekend reviewed.

## 2019-11-02 NOTE — Patient Instructions (Addendum)
Tdap today You have external ear infection - treat with antibiotic sent to pharmacy This will also cover recent puncture wound to right thumb.  Let us know if not improving each day.  If fever, worsening pain/swelling at ear, seek urgent care evaluation.  Take ibuprofen 600mg  with meals for ear pain/inflammation.   Otitis Externa  Otitis externa is an infection of the outer ear canal. The outer ear canal is the area between the outside of the ear and the eardrum. Otitis externa is sometimes called swimmer's ear. What are the causes? Common causes of this condition include:  Swimming in dirty water.  Moisture in the ear.  An injury to the inside of the ear.  An object stuck in the ear.  A cut or scrape on the outside of the ear. What increases the risk? You are more likely to develop this condition if you go swimming often. What are the signs or symptoms? The first symptom of this condition is often itching in the ear. Later symptoms of the condition include:  Swelling of the ear.  Redness in the ear.  Ear pain. The pain may get worse when you pull on your ear.  Pus coming from the ear. How is this diagnosed? This condition may be diagnosed by examining the ear and testing fluid from the ear for bacteria and funguses. How is this treated? This condition may be treated with:  Antibiotic ear drops. These are often given for 10-14 days.  Medicines to reduce itching and swelling. Follow these instructions at home:  If you were prescribed antibiotic ear drops, use them as told by your health care provider. Do not stop using the antibiotic even if your condition improves.  Take over-the-counter and prescription medicines only as told by your health care provider.  Avoid getting water in your ears as told by your health care provider. This may include avoiding swimming or water sports for a few days.  Keep all follow-up visits as told by your health care provider. This is  important. How is this prevented?  Keep your ears dry. Use the corner of a towel to dry your ears after you swim or bathe.  Avoid scratching or putting things in your ear. Doing these things can damage the ear canal or remove the protective wax that lines it, which makes it easier for bacteria and funguses to grow.  Avoid swimming in lakes, polluted water, or pools that may not have enough chlorine. Contact a health care provider if:  You have a fever.  Your ear is still red, swollen, painful, or draining pus after 3 days.  Your redness, swelling, or pain gets worse.  You have a severe headache.  You have redness, swelling, pain, or tenderness in the area behind your ear. Summary  Otitis externa is an infection of the outer ear canal.  Common causes include swimming in dirty water, moisture in the ear, or a cut or scrape in the ear.  Symptoms include pain, redness, and swelling of the ear.  If you were prescribed antibiotic ear drops, use them as told by your health care provider. Do not stop using the antibiotic even if your condition improves. This information is not intended to replace advice given to you by your health care provider. Make sure you discuss any questions you have with your health care provider. Document Released: 10/26/2005 Document Revised: 04/01/2018 Document Reviewed: 04/01/2018 Elsevier Patient Education  2020 [employer].

## 2020-04-03 ENCOUNTER — Telehealth (INDEPENDENT_AMBULATORY_CARE_PROVIDER_SITE_OTHER): Payer: 59 | Admitting: Family Medicine

## 2020-04-03 ENCOUNTER — Encounter: Payer: Self-pay | Admitting: Family Medicine

## 2020-04-03 DIAGNOSIS — J019 Acute sinusitis, unspecified: Secondary | ICD-10-CM | POA: Insufficient documentation

## 2020-04-03 DIAGNOSIS — J0110 Acute frontal sinusitis, unspecified: Secondary | ICD-10-CM | POA: Diagnosis not present

## 2020-04-03 MED ORDER — FLUTICASONE PROPIONATE 50 MCG/ACT NA SUSP: 2.0000 | Freq: Every day | NASAL | 6 refills | Status: DC

## 2020-04-03 MED ORDER — PREDNISONE 10 MG PO TABS: ORAL_TABLET | ORAL | 0 refills | Status: DC

## 2020-04-03 MED ORDER — AZITHROMYCIN 250 MG PO TABS: ORAL_TABLET | ORAL | 0 refills | Status: DC

## 2020-04-03 MED ORDER — HYDROCODONE-HOMATROPINE 5-1.5 MG/5ML PO SYRP: 5.0000 mL | ORAL_SOLUTION | Freq: Three times a day (TID) | ORAL | 0 refills | Status: DC | PRN

## 2020-04-03 NOTE — Assessment & Plan Note (Signed)
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

## 2020-04-03 NOTE — Patient Instructions (Addendum)
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

## 2020-04-03 NOTE — Progress Notes (Signed)
Virtual Visit via Video Note  I connected with Raymond West on 04/03/20 at 10:30 AM EDT by a video enabled telemedicine application and verified that I am speaking with the correct person using two identifiers.  Location: Patient: home Provider: office    I discussed the limitations of evaluation and management by telemedicine and the availability of in person appointments. The patient expressed understanding and agreed to proceed.  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 Acute sinusitis 04/03/2020  . External otitis of left ear 11/02/2019  . Puncture wound of right thumb 11/02/2019  . Ramsay Hunt syndrome (geniculate herpes zoster) 02/13/2019  . H/O: pneumonia 03/18/2018  . Class 2 obesity due to excess calories with body mass index (BMI) of 37.0 to 37.9 in adult 06/26/2016  . Routine general medical examination at a health care facility 05/27/2012  . Essential hypertension 01/15/2010   Past Medical History:  Diagnosis Date  . Neoplasm of uncertain behavior of skin   . Unspecified essential hypertension    History reviewed. No pertinent surgical history. Social History   Tobacco Use  . Smoking status: Never Smoker  . Smokeless tobacco: Never Used  Substance Use Topics   . Alcohol use: Yes    Alcohol/week: 0.0 standard drinks    Comment: rarely  . Drug use: No   Family History  Problem Relation Age of Onset  . Leukemia Mother   . Diabetes Mother   . Kidney disease Father        has been on dialysis for years  . Diabetes Father        ? -unsure  . Arthritis Maternal Grandfather   . Arthritis Maternal Grandmother   . Arthritis Paternal Grandfather   . Arthritis Paternal Grandmother    Allergies  Allergen Reactions  . Amoxicillin     REACTION: rash  . Benazepril Cough  . Keflex [Cephalexin]     Rash   . Penicillins Rash   Current Outpatient Medications on File Prior to Visit  Medication Sig Dispense Refill  . amLODipine (NORVASC) 5 MG tablet TAKE 1 TABLET BY MOUTH EVERY DAY 90 tablet 3  . loratadine (CLARITIN) 10 MG tablet Take 1 tablet (10 mg total) by mouth daily as needed for allergies. 90 tablet 2  . losartan (COZAAR) 100 MG tablet Take 0.5 tablets (50 mg total) by mouth daily. 45 tablet 3   No current facility-administered medications on file prior to visit.   Review of Systems  Constitutional: Negative for chills, fever and malaise/fatigue.  HENT: Positive for congestion and sinus pain. Negative for ear pain and sore throat.   Eyes: Negative  for blurred vision, discharge and redness.  Respiratory: Positive for cough and sputum production. Negative for shortness of breath, wheezing and stridor.   Cardiovascular: Negative for chest pain, palpitations and leg swelling.  Gastrointestinal: Negative for abdominal pain, diarrhea, nausea and vomiting.  Musculoskeletal: Negative for myalgias.  Skin: Negative for rash.  Neurological: Negative for dizziness and headaches.     Observations/Objective: Patient appears well, in no distress Weight is baseline  No facial swelling or asymmetry Normal voice-not hoarse and no slurred speech (does sound nasally congested) No obvious tremor or mobility impairment Moving neck and UEs normally Able  to hear the call well  No cough or shortness of breath during interview  Talkative and mentally sharp with no cognitive changes No skin changes on face or neck , no rash or pallor Affect is normal    Assessment and Plan: Problem List Items Addressed This Visit      Respiratory   Acute sinusitis    Suspect this started with allergy congestion from working in hay (adv he start using a mask for that and he agreed)  inst to continue zyrtec and add flonase for the allergy season daily   Px zithromax and pred taper 30 mg inst to watch for worse cough/sob or any new symptoms  Refilled hycodan for cough with caution of sedation Update if not starting to improve in a week or if worsening   If worse or no improvement -consider covid testing (he is immunized with no sick contacts so risk is very low)       Relevant Medications   predniSONE (DELTASONE) 10 MG tablet   azithromycin (ZITHROMAX Z-PAK) 250 MG tablet   fluticasone (FLONASE) 50 MCG/ACT nasal spray   HYDROcodone-homatropine (HYCODAN) 5-1.5 MG/5ML syrup       Follow Up Instructions: Take the zpack for a sinus infection  Prednisone for chest tightness and cough and congestion I sent hydodan for cough- use caution of sedation with that  Use the flonase spray and zyrtec during the allergy season 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 The patient was provided an opportunity to ask questions and all were answered. The patient agreed with the plan and demonstrated an understanding of the instructions.   The patient was advised to call back or seek an in-person evaluation if the symptoms worsen or if the condition fails to improve as anticipated.     Loura Pardon, MD

## 2020-09-20 ENCOUNTER — Ambulatory Visit (INDEPENDENT_AMBULATORY_CARE_PROVIDER_SITE_OTHER): Payer: 59 | Admitting: Family Medicine

## 2020-09-20 ENCOUNTER — Encounter: Payer: Self-pay | Admitting: Family Medicine

## 2020-09-20 ENCOUNTER — Other Ambulatory Visit: Payer: Self-pay

## 2020-09-20 VITALS — BP 130/76 | HR 75 | Temp 97.2°F | Ht 70.0 in | Wt 260.1 lb

## 2020-09-20 DIAGNOSIS — Z23 Encounter for immunization: Secondary | ICD-10-CM | POA: Diagnosis not present

## 2020-09-20 DIAGNOSIS — I10 Essential (primary) hypertension: Secondary | ICD-10-CM | POA: Diagnosis not present

## 2020-09-20 DIAGNOSIS — Z6837 Body mass index (BMI) 37.0-37.9, adult: Secondary | ICD-10-CM

## 2020-09-20 DIAGNOSIS — Z0000 Encounter for general adult medical examination without abnormal findings: Secondary | ICD-10-CM | POA: Diagnosis not present

## 2020-09-20 MED ORDER — LOSARTAN POTASSIUM 100 MG PO TABS
50.0000 mg | ORAL_TABLET | Freq: Every day | ORAL | 3 refills | Status: DC
Start: 2020-09-20 — End: 2020-09-26

## 2020-09-20 MED ORDER — AMLODIPINE BESYLATE 5 MG PO TABS
ORAL_TABLET | ORAL | 3 refills | Status: DC
Start: 2020-09-20 — End: 2020-09-26

## 2020-09-20 NOTE — Patient Instructions (Addendum)
COVID-19 Vaccine Information can be found at: ShippingScam.co.uk For questions related to vaccine distribution or appointments, please email [email or web address] or call [phone].   I recommend a booster   Flu shot today   For cholesterol  Avoid red meat/ fried foods/ egg yolks/ fatty breakfast meats/ butter, cheese and high fat dairy/ and shellfish    To avoid diabetes  Try to get most of your carbohydrates from produce (with the exception of white potatoes)  Eat less bread/pasta/rice/snack foods/cereals/sweets and other items from the middle of the grocery store (processed carbs)

## 2020-09-20 NOTE — Assessment & Plan Note (Signed)
Discussed how this problem influences overall health and the risks it imposes  Reviewed plan for weight loss with lower calorie diet (via better food choices and also portion control or program like weight watchers) and exercise building up to or more than 30 minutes 5 days per week including some aerobic activity   Disc strategy of eating less processed carbs Family is discussing the optivia program after the holidays  Fair amt of exercise with physical work- enc more on days he does less

## 2020-09-20 NOTE — Assessment & Plan Note (Signed)
bp in fair control at this time  BP Readings from Last 1 Encounters:  09/20/20 130/76   No changes needed Most recent labs reviewed  Disc lifstyle change with low sodium diet and exercise  Plan to continue amlodipine 5 mg daily and losartan 50 mg daily  Labs today

## 2020-09-20 NOTE — Assessment & Plan Note (Signed)
Reviewed health habits including diet and exercise and skin cancer prevention Reviewed appropriate screening tests for age  Also reviewed health mt list, fam hx and immunization status , as well as social and family history   See HPI Labs ordered Disc strategy for wt loss/ DM avoidance - eating less processed foods  Flu shot today  Planning covid booster-given web site  Enc use of sun protection outdoors

## 2020-09-20 NOTE — Progress Notes (Signed)
Subjective:    Patient ID: Raymond West, male    DOB: 1976/07/19, 44 y.o.   MRN: [id number]  This visit occurred during the SARS-CoV-2 public health emergency.  Safety protocols were in place, including screening questions prior to the visit, additional usage of staff PPE, and extensive cleaning of exam room while observing appropriate contact time as indicated for disinfecting solutions.    HPI Here for health maintenance exam and to review chronic medical problems    Wt Readings from Last 3 Encounters:  09/20/20 260 lb 1 oz (118 kg)  04/03/20 260 lb (117.9 kg)  11/02/19 264 lb 5 oz (119.9 kg)   37.32 kg/m  Feeling ok  Taking care of himself  Flu shot -today  Gets a lot of exercise work/ home - working outdoors/physical  Diet is so/so   (not great this week-working late)  In a rush - tries hard to avoid fast food   covid imm- pfizer 5/21   Tdap 12/20  pna 23 vaccine utd   Prostate health  No frequent urination  No nocturia  No prostate cancer in the family   Diabetes in family -both parents  Father had kidney failure    HTN  bp is stable today  No cp or palpitations or headaches or edema  No side effects to medicines  BP Readings from Last 3 Encounters:  09/20/20 130/76  11/02/19 120/74  09/19/19 126/74    Takes amlodipine 5 mg daily and losartan  50 mg daily    Due for labs today   Last cholesterol Lab Results  Component Value Date   CHOL 179 09/19/2019   HDL 39.60 09/19/2019   LDLCALC 104 (H) 09/19/2019   LDLDIRECT 118.5 05/29/2013   TRIG 175.0 (H) 09/19/2019   CHOLHDL 5 09/19/2019    Patient Active Problem List   Diagnosis Date Noted   Ramsay Hunt syndrome (geniculate herpes zoster) 02/13/2019   H/O: pneumonia 03/18/2018   Class 2 obesity due to excess calories with body mass index (BMI) of 37.0 to 37.9 in adult 06/26/2016   Routine general medical examination at a health care facility 05/27/2012   Essential hypertension 01/15/2010    Past Medical History:  Diagnosis Date   Neoplasm of uncertain behavior of skin    Unspecified essential hypertension    History reviewed. No pertinent surgical history. Social History   Tobacco Use   Smoking status: Never Smoker   Smokeless tobacco: Never Used  Substance Use Topics   Alcohol use: Yes    Alcohol/week: 0.0 standard drinks    Comment: rarely   Drug use: No   Family History  Problem Relation Age of Onset   Leukemia Mother    Diabetes Mother    Kidney disease Father        has been on dialysis for years   Diabetes Father        ? -unsure   Arthritis Maternal Grandfather    Arthritis Maternal Grandmother    Arthritis Paternal Grandfather    Arthritis Paternal Grandmother    Allergies  Allergen Reactions   Amoxicillin     REACTION: rash   Benazepril Cough   Keflex [Cephalexin]     Rash    Penicillins Rash   Current Outpatient Medications on File Prior to Visit  Medication Sig Dispense Refill   loratadine (CLARITIN) 10 MG tablet Take 1 tablet (10 mg total) by mouth daily as needed for allergies. 90 tablet 2   No current facility-administered medications  on file prior to visit.    Review of Systems  Constitutional: Negative for activity change, appetite change, fatigue, fever and unexpected weight change.  HENT: Negative for congestion, rhinorrhea, sore throat and trouble swallowing.   Eyes: Negative for pain, redness, itching and visual disturbance.  Respiratory: Negative for cough, chest tightness, shortness of breath and wheezing.   Cardiovascular: Negative for chest pain and palpitations.  Gastrointestinal: Negative for abdominal pain, blood in stool, constipation, diarrhea and nausea.  Endocrine: Negative for cold intolerance, heat intolerance, polydipsia and polyuria.  Genitourinary: Negative for difficulty urinating, dysuria, frequency and urgency.  Musculoskeletal: Negative for arthralgias, joint swelling and myalgias.   Skin: Negative for pallor and rash.  Neurological: Negative for dizziness, tremors, weakness, numbness and headaches.  Hematological: Negative for adenopathy. Does not bruise/bleed easily.  Psychiatric/Behavioral: Negative for decreased concentration and dysphoric mood. The patient is not nervous/anxious.        Objective:   Physical Exam Constitutional:      General: He is not in acute distress.    Appearance: Normal appearance. He is well-developed. He is obese. He is not ill-appearing or diaphoretic.  HENT:     Head: Normocephalic and atraumatic.     Right Ear: Tympanic membrane, ear canal and external ear normal.     Left Ear: Tympanic membrane, ear canal and external ear normal.     Nose: Nose normal. No congestion.     Mouth/Throat:     Mouth: Mucous membranes are moist.     Pharynx: Oropharynx is clear. No posterior oropharyngeal erythema.  Eyes:     General: No scleral icterus.       Right eye: No discharge.        Left eye: No discharge.     Conjunctiva/sclera: Conjunctivae normal.     Pupils: Pupils are equal, round, and reactive to light.  Neck:     Thyroid: No thyromegaly.     Vascular: No carotid bruit or JVD.  Cardiovascular:     Rate and Rhythm: Normal rate and regular rhythm.     Pulses: Normal pulses.     Heart sounds: Normal heart sounds. No gallop.   Pulmonary:     Effort: Pulmonary effort is normal. No respiratory distress.     Breath sounds: Normal breath sounds. No wheezing or rales.     Comments: Good air exch Chest:     Chest wall: No tenderness.  Abdominal:     General: Bowel sounds are normal. There is no distension or abdominal bruit.     Palpations: Abdomen is soft. There is no mass.     Tenderness: There is no abdominal tenderness.     Hernia: No hernia is present.  Musculoskeletal:        General: No tenderness.     Cervical back: Normal range of motion and neck supple. No rigidity. No muscular tenderness.     Right lower leg: No edema.      Left lower leg: No edema.  Lymphadenopathy:     Cervical: No cervical adenopathy.  Skin:    General: Skin is warm and dry.     Coloration: Skin is not pale.     Findings: No erythema or rash.     Comments: Ruddy complexion  Some lentigines  Neurological:     Mental Status: He is alert.     Cranial Nerves: No cranial nerve deficit.     Motor: No abnormal muscle tone.     Coordination: Coordination normal.  Gait: Gait normal.     Deep Tendon Reflexes: Reflexes are normal and symmetric. Reflexes normal.  Psychiatric:        Mood and Affect: Mood normal.        Cognition and Memory: Cognition and memory normal.     Comments: Pleasant            Assessment & Plan:   Problem List Items Addressed This Visit      Cardiovascular and Mediastinum   Essential hypertension    bp in fair control at this time  BP Readings from Last 1 Encounters:  09/20/20 130/76   No changes needed Most recent labs reviewed  Disc lifstyle change with low sodium diet and exercise  Plan to continue amlodipine 5 mg daily and losartan 50 mg daily  Labs today      Relevant Medications   amLODipine (NORVASC) 5 MG tablet   losartan (COZAAR) 100 MG tablet   Other Relevant Orders   Comprehensive metabolic panel   CBC with Differential/Platelet   Lipid panel   TSH     Other   Routine general medical examination at a health care facility - Primary    Reviewed health habits including diet and exercise and skin cancer prevention Reviewed appropriate screening tests for age  Also reviewed health mt list, fam hx and immunization status , as well as social and family history   See HPI Labs ordered Disc strategy for wt loss/ DM avoidance - eating less processed foods  Flu shot today  Planning covid booster-given web site  Enc use of sun protection outdoors       Class 2 obesity due to excess calories with body mass index (BMI) of 37.0 to 37.9 in adult    Discussed how this problem  influences overall health and the risks it imposes  Reviewed plan for weight loss with lower calorie diet (via better food choices and also portion control or program like weight watchers) and exercise building up to or more than 30 minutes 5 days per week including some aerobic activity   Disc strategy of eating less processed carbs Family is discussing the optivia program after the holidays  Fair amt of exercise with physical work- enc more on days he does less

## 2020-09-21 LAB — CBC WITH DIFFERENTIAL/PLATELET
Absolute Monocytes: 773 cells/uL (ref 200–950)
Basophils Absolute: 45 cells/uL (ref 0–200)
Basophils Relative: 0.4 %
Eosinophils Absolute: 258 cells/uL (ref 15–500)
Eosinophils Relative: 2.3 %
HCT: 45.4 % (ref 38.5–50.0)
Hemoglobin: 15.8 g/dL (ref 13.2–17.1)
Lymphs Abs: 3931 cells/uL — ABNORMAL HIGH (ref 850–3900)
MCH: 29.9 pg (ref 27.0–33.0)
MCHC: 34.8 g/dL (ref 32.0–36.0)
MCV: 86 fL (ref 80.0–100.0)
MPV: 10.7 fL (ref 7.5–12.5)
Monocytes Relative: 6.9 %
Neutro Abs: 6194 cells/uL (ref 1500–7800)
Neutrophils Relative %: 55.3 %
Platelets: 241 10*3/uL (ref 140–400)
RBC: 5.28 10*6/uL (ref 4.20–5.80)
RDW: 12.6 % (ref 11.0–15.0)
Total Lymphocyte: 35.1 %
WBC: 11.2 10*3/uL — ABNORMAL HIGH (ref 3.8–10.8)

## 2020-09-21 LAB — LIPID PANEL
Cholesterol: 170 mg/dL (ref <200)
HDL: 42 mg/dL (ref >=40)
LDL Cholesterol (Calc): 106 mg/dL (calc) — ABNORMAL HIGH
Non-HDL Cholesterol (Calc): 128 mg/dL (calc) (ref <130)
Total CHOL/HDL Ratio: 4 (calc) (ref <5.0)
Triglycerides: 122 mg/dL (ref <150)

## 2020-09-21 LAB — COMPREHENSIVE METABOLIC PANEL
AG Ratio: 2 (calc) (ref 1.0–2.5)
ALT: 37 U/L (ref 9–46)
AST: 28 U/L (ref 10–40)
Albumin: 4.7 g/dL (ref 3.6–5.1)
Alkaline phosphatase (APISO): 63 U/L (ref 36–130)
BUN: 17 mg/dL (ref 7–25)
CO2: 26 mmol/L (ref 20–32)
Calcium: 9.8 mg/dL (ref 8.6–10.3)
Chloride: 103 mmol/L (ref 98–110)
Creat: 0.93 mg/dL (ref 0.60–1.35)
Globulin: 2.3 g/dL (calc) (ref 1.9–3.7)
Glucose, Bld: 88 mg/dL (ref 65–99)
Potassium: 4.5 mmol/L (ref 3.5–5.3)
Sodium: 141 mmol/L (ref 135–146)
Total Bilirubin: 0.8 mg/dL (ref 0.2–1.2)
Total Protein: 7 g/dL (ref 6.1–8.1)

## 2020-09-21 LAB — TSH: TSH: 2 mIU/L (ref 0.40–4.50)

## 2020-09-23 ENCOUNTER — Encounter: Payer: Self-pay | Admitting: Family Medicine

## 2020-09-23 ENCOUNTER — Telehealth: Payer: Self-pay | Admitting: Family Medicine

## 2020-09-23 NOTE — Telephone Encounter (Signed)
Please email  DPR  To [email or web address]

## 2020-09-24 ENCOUNTER — Encounter: Payer: Self-pay | Admitting: Family Medicine

## 2020-09-24 DIAGNOSIS — D72829 Elevated white blood cell count, unspecified: Secondary | ICD-10-CM

## 2020-09-25 ENCOUNTER — Other Ambulatory Visit: Payer: Self-pay | Admitting: Family Medicine

## 2020-09-25 DIAGNOSIS — D72829 Elevated white blood cell count, unspecified: Secondary | ICD-10-CM | POA: Insufficient documentation

## 2020-09-25 NOTE — Telephone Encounter (Signed)
Please call pt to schedule non fasting labs in about 2 months  Orders are in  Thanks

## 2020-11-07 ENCOUNTER — Telehealth: Payer: Self-pay | Admitting: Family Medicine

## 2020-11-07 NOTE — Telephone Encounter (Signed)
Pt called in wanted to know if he can get something for sinus infection his wife, child is battling the samething

## 2020-11-07 NOTE — Telephone Encounter (Signed)
How many days has he had symptoms? Started getting sxs yesterday that worsen today  Has he developed sinus pain? No just congestion What color is the nasal mucous? Clear mucous, with cough, and sneezing  Any fever? No fever

## 2020-11-07 NOTE — Telephone Encounter (Signed)
How many days has he had symptoms?  Has he developed sinus pain? What color is the nasal mucous? Any fever?

## 2020-11-07 NOTE — Telephone Encounter (Signed)
Sounds like a cold at this point (takes more than a week to turn into a bacterial sinus infection)  If after 7-10 days he develops sinus pain, thick colored nasal d/c or fever then let us know  Treat symptoms / drink fluids

## 2020-11-12 ENCOUNTER — Encounter: Payer: Self-pay | Admitting: *Deleted

## 2020-11-12 NOTE — Telephone Encounter (Signed)
Sent mychart letting pt know Dr. Tower's comments. 

## 2020-11-13 ENCOUNTER — Other Ambulatory Visit: Payer: Self-pay

## 2020-11-13 ENCOUNTER — Other Ambulatory Visit (INDEPENDENT_AMBULATORY_CARE_PROVIDER_SITE_OTHER): Payer: 59

## 2020-11-13 DIAGNOSIS — D72829 Elevated white blood cell count, unspecified: Secondary | ICD-10-CM | POA: Diagnosis not present

## 2020-11-14 LAB — CBC WITH DIFFERENTIAL/PLATELET
Absolute Monocytes: 604 cells/uL (ref 200–950)
Basophils Absolute: 51 cells/uL (ref 0–200)
Basophils Relative: 0.6 %
Eosinophils Absolute: 374 cells/uL (ref 15–500)
Eosinophils Relative: 4.4 %
HCT: 45.3 % (ref 38.5–50.0)
Hemoglobin: 15.3 g/dL (ref 13.2–17.1)
Lymphs Abs: 3077 cells/uL (ref 850–3900)
MCH: 29.1 pg (ref 27.0–33.0)
MCHC: 33.8 g/dL (ref 32.0–36.0)
MCV: 86.3 fL (ref 80.0–100.0)
MPV: 10.6 fL (ref 7.5–12.5)
Monocytes Relative: 7.1 %
Neutro Abs: 4395 cells/uL (ref 1500–7800)
Neutrophils Relative %: 51.7 %
Platelets: 213 10*3/uL (ref 140–400)
RBC: 5.25 10*6/uL (ref 4.20–5.80)
RDW: 12.6 % (ref 11.0–15.0)
Total Lymphocyte: 36.2 %
WBC: 8.5 10*3/uL (ref 3.8–10.8)

## 2020-11-14 LAB — PATHOLOGIST SMEAR REVIEW

## 2021-01-27 ENCOUNTER — Other Ambulatory Visit: Payer: Self-pay

## 2021-01-27 ENCOUNTER — Ambulatory Visit (INDEPENDENT_AMBULATORY_CARE_PROVIDER_SITE_OTHER): Payer: 59 | Admitting: Dermatology

## 2021-01-27 DIAGNOSIS — L578 Other skin changes due to chronic exposure to nonionizing radiation: Secondary | ICD-10-CM

## 2021-01-27 DIAGNOSIS — Z1283 Encounter for screening for malignant neoplasm of skin: Secondary | ICD-10-CM | POA: Diagnosis not present

## 2021-01-27 DIAGNOSIS — Z86018 Personal history of other benign neoplasm: Secondary | ICD-10-CM

## 2021-01-27 DIAGNOSIS — B353 Tinea pedis: Secondary | ICD-10-CM

## 2021-01-27 DIAGNOSIS — L814 Other melanin hyperpigmentation: Secondary | ICD-10-CM

## 2021-01-27 DIAGNOSIS — L821 Other seborrheic keratosis: Secondary | ICD-10-CM

## 2021-01-27 DIAGNOSIS — D1800 Hemangioma unspecified site: Secondary | ICD-10-CM

## 2021-01-27 DIAGNOSIS — L918 Other hypertrophic disorders of the skin: Secondary | ICD-10-CM

## 2021-01-27 DIAGNOSIS — D229 Melanocytic nevi, unspecified: Secondary | ICD-10-CM

## 2021-01-27 MED ORDER — KETOCONAZOLE 2 % EX CREA
TOPICAL_CREAM | CUTANEOUS | 11 refills | Status: DC
Start: 2021-01-27 — End: 2023-10-06

## 2021-01-27 NOTE — Progress Notes (Signed)
   Follow-Up Visit   Subjective  Raymond West is a 45 y.o. male who presents for the following: TBSE (Patient here for full body skin exam and skin cancer screening. Patient with a hx of dysplastic nevus. He is not aware of anything new or changing. ).  Patient still has some persistent fungus of the feet.  The following portions of the chart were reviewed this encounter and updated as appropriate:   Tobacco  Allergies  Meds  Problems  Med Hx  Surg Hx  Fam Hx     Review of Systems:  No other skin or systemic complaints except as noted in HPI or Assessment and Plan.  Objective  Well appearing patient in no apparent distress; mood and affect are within normal limits.  A full examination was performed including scalp, head, eyes, ears, nose, lips, neck, chest, axillae, abdomen, back, buttocks, bilateral upper extremities, bilateral lower extremities, hands, feet, fingers, toes, fingernails, and toenails. All findings within normal limits unless otherwise noted below.  Objective  bilateral feet: Scaling and maceration web spaces and over distal and lateral soles.    Assessment & Plan  Tinea pedis of right foot bilateral feet Restart ketoconazole 2% cream to feet and in between toes nightly.  Chronic and persistent  Ordered Medications: ketoconazole (NIZORAL) 2 % cream   Lentigines - Scattered tan macules - Due to sun exposure - Benign-appering, observe - Recommend daily broad spectrum sunscreen SPF 30+ to sun-exposed areas, reapply every 2 hours as needed. - Call for any changes  Seborrheic Keratoses - Stuck-on, waxy, tan-brown papules and plaques  - Discussed benign etiology and prognosis. - Observe - Call for any changes  Melanocytic Nevi - Tan-brown and/or pink-flesh-colored symmetric macules and papules - Benign appearing on exam today - Observation - Call clinic for new or changing moles - Recommend daily use of broad spectrum spf 30+ sunscreen to sun-exposed  areas.   Hemangiomas - Red papules - Discussed benign nature - Observe - Call for any changes  Actinic Damage - Chronic, secondary to cumulative UV/sun exposure - diffuse scaly erythematous macules with underlying dyspigmentation - Recommend daily broad spectrum sunscreen SPF 30+ to sun-exposed areas, reapply every 2 hours as needed.  - Call for new or changing lesions.  Skin cancer screening performed today.  History of Dysplastic Nevi - No evidence of recurrence today at right upper back - Recommend regular full body skin exams - Recommend daily broad spectrum sunscreen SPF 30+ to sun-exposed areas, reapply every 2 hours as needed.  - Call if any new or changing lesions are noted between office visits  Acrochordons (Skin Tags) - Fleshy, skin-colored pedunculated papules at axillary - Benign appearing.  - Observe. - If desired, they can be removed with an in office procedure that is not covered by insurance. - Please call the clinic if you notice any new or changing lesions.  Return in about 1 year (around 01/27/2022) for TBSE.  Graciella Belton, RMA, am acting as scribe for Sarina Ser, MD . Documentation: I have reviewed the above documentation for accuracy and completeness, and I agree with the above.  Sarina Ser, MD

## 2021-01-27 NOTE — Patient Instructions (Signed)

## 2021-01-28 ENCOUNTER — Telehealth: Payer: Self-pay | Admitting: Family Medicine

## 2021-01-28 MED ORDER — LOSARTAN POTASSIUM 100 MG PO TABS: 50.0000 mg | ORAL_TABLET | Freq: Every day | ORAL | 2 refills | Status: DC

## 2021-01-28 MED ORDER — AMLODIPINE BESYLATE 5 MG PO TABS: 5.0000 mg | ORAL_TABLET | Freq: Every day | ORAL | 2 refills | Status: DC

## 2021-01-28 NOTE — Telephone Encounter (Signed)
meds filled

## 2021-01-28 NOTE — Telephone Encounter (Signed)
  LAST APPOINTMENT DATE: 11/07/2020   NEXT APPOINTMENT DATE:@Visit  date not found  MEDICATION: amlodipine and Losartan  PHARMACY: [facility] [address])  Let patient know to contact pharmacy at the end of the day to make sure medication is ready.  Please notify patient to allow 48-72 hours to process  Encourage patient to contact the pharmacy for refills or they can request refills through [facility]:   LAST REFILL:  QTY:  REFILL DATE:    OTHER COMMENTS:    Okay for refill?  Please advise

## 2021-01-28 NOTE — Addendum Note (Signed)
Addended by: Tammi Sou on: 01/28/2021 03:03 PM   Modules accepted: Orders

## 2021-01-29 ENCOUNTER — Encounter: Payer: Self-pay | Admitting: Dermatology

## 2021-02-05 ENCOUNTER — Telehealth: Payer: Self-pay | Admitting: Family Medicine

## 2021-02-05 MED ORDER — SCOPOLAMINE 1 MG/3DAYS TD PT72: 1.0000 | MEDICATED_PATCH | TRANSDERMAL | 0 refills | Status: DC

## 2021-02-05 NOTE — Telephone Encounter (Signed)
Pt's wife messaged  req motion sickness patch for him for a cruise Please let him know I sent it to [facility]

## 2021-02-06 NOTE — Telephone Encounter (Signed)
Sent mychart letting them know

## 2021-09-08 ENCOUNTER — Telehealth (INDEPENDENT_AMBULATORY_CARE_PROVIDER_SITE_OTHER): Payer: 59 | Admitting: Nurse Practitioner

## 2021-09-08 ENCOUNTER — Other Ambulatory Visit: Payer: Self-pay

## 2021-09-08 ENCOUNTER — Encounter: Payer: Self-pay | Admitting: Nurse Practitioner

## 2021-09-08 VITALS — Temp 99.0°F

## 2021-09-08 DIAGNOSIS — R051 Acute cough: Secondary | ICD-10-CM | POA: Insufficient documentation

## 2021-09-08 MED ORDER — BENZONATATE 200 MG PO CAPS
200.0000 mg | ORAL_CAPSULE | Freq: Three times a day (TID) | ORAL | 0 refills | Status: AC | PRN
Start: 2021-09-08 — End: 2021-09-18

## 2021-09-08 NOTE — Assessment & Plan Note (Signed)
Patient presents with what sounds like viral illness.  Most symptoms have resolved minus the cough which is now starting to produce yellow sputum.  Patient's been using over-the-counter medications inclusive of Delsym today that seems to be helping symptom management.  Patient is concerned because he does have a history of pneumonia and does not milligrams in route again.  Given that symptoms are improving minus cough with some production did discuss patient I do not think antibiotic is needed at this time.  He does know that if he continues to not improve or declines over the next few days to reach out via MyChart we can always consider antibiotic use.  We will send in some Tessalon Perles for as needed use he can either use his Delsym or my Ladona Ridgel he is aware of this.  Continue monitoring.

## 2021-09-08 NOTE — Progress Notes (Signed)
Patient ID: Raymond West, male    DOB: Dec 18, 1975, 45 y.o.   MRN: [id number]  Virtual visit completed through [place], a video enabled telemedicine application. Due to national recommendations of social distancing due to COVID-19, a virtual visit is felt to be most appropriate for this patient at this time. Reviewed limitations, risks, security and privacy concerns of performing a virtual visit and the availability of in person appointments. I also reviewed that there may be a patient responsible charge related to this service. The patient agreed to proceed.   Patient location: home Provider location:  at [facility], office Persons participating in this virtual visit: patient, provider   If any vitals were documented, they were collected by patient at home unless specified below.    Temp 99 F (37.2 C) Comment: per patient   CC: Cough Subjective:   HPI: Raymond West is a 45 y.o. male presenting on 09/08/2021 for Cough (Sx started on 09/02/21. Started having fever on 09/05/21-highest was 102, chest pain from coughing, headache, runny nose, sneezing, post nasal drip.)  Symptoms 09/02/2021 cough 09/05/2021 constellations of fever Tested for covid today and was negative Vaccine for [employer] x2 He has been taking delsyum and mucinex dm Therma flu express max nighttime and daytime Cough is still the same other symptoms have improved  Sick contacts daughter, her class has had the flu  Patient is going to test for covid at home   Relevant past medical, surgical, family and social history reviewed and updated as indicated. Interim medical history since our last visit reviewed. Allergies and medications reviewed and updated. Outpatient Medications Prior to Visit  Medication Sig Dispense Refill   amLODipine (NORVASC) 5 MG tablet Take 1 tablet (5 mg total) by mouth daily. 90 tablet 2   ketoconazole (NIZORAL) 2 % cream Apply to feet and in between toes nightly. 60 g 11    loratadine (CLARITIN) 10 MG tablet Take 1 tablet (10 mg total) by mouth daily as needed for allergies. 90 tablet 2   losartan (COZAAR) 100 MG tablet Take 0.5 tablets (50 mg total) by mouth daily. 45 tablet 2   scopolamine (TRANSDERM-SCOP, 1.5 MG,) 1 MG/3DAYS Place 1 patch (1.5 mg total) onto the skin every 3 (three) days. 10 patch 0   No facility-administered medications prior to visit.     Per HPI unless specifically indicated in ROS section below Review of Systems  Constitutional:  Negative for chills, fatigue and fever.  HENT:  Negative for congestion, postnasal drip, rhinorrhea and sore throat.   Respiratory:  Positive for cough (yellow stuff). Negative for shortness of breath.   Cardiovascular:  Positive for chest pain (with cough).  Gastrointestinal:  Negative for abdominal pain, diarrhea, nausea and vomiting.  Musculoskeletal:  Negative for arthralgias and myalgias.  Neurological:  Positive for headaches.  Objective:  Temp 99 F (37.2 C) Comment: per patient  Wt Readings from Last 3 Encounters:  09/20/20 260 lb 1 oz (118 kg)  04/03/20 260 lb (117.9 kg)  11/02/19 264 lb 5 oz (119.9 kg)       Physical exam: Gen: alert, NAD, not ill appearing Pulm: speaks in complete sentences without increased work of breathing Psych: normal mood, normal thought content      Results for orders placed or performed in visit on 11/13/20  CBC with Differential/Platelet  Result Value Ref Range   WBC 8.5 3.8 - 10.8 Thousand/uL   RBC 5.25 4.20 - 5.80 Million/uL   Hemoglobin 15.3 13.2 -  17.1 g/dL   HCT 45.3 38.5 - 50.0 %   MCV 86.3 80.0 - 100.0 fL   MCH 29.1 27.0 - 33.0 pg   MCHC 33.8 32.0 - 36.0 g/dL   RDW 12.6 11.0 - 15.0 %   Platelets 213 140 - 400 Thousand/uL   MPV 10.6 7.5 - 12.5 fL   Neutro Abs 4,395 1,500 - 7,800 cells/uL   Lymphs Abs 3,077 850 - 3,900 cells/uL   Absolute Monocytes 604 200 - 950 cells/uL   Eosinophils Absolute 374 15 - 500 cells/uL   Basophils Absolute 51 0 - 200  cells/uL   Neutrophils Relative % 51.7 %   Total Lymphocyte 36.2 %   Monocytes Relative 7.1 %   Eosinophils Relative 4.4 %   Basophils Relative 0.6 %  Pathologist smear review  Result Value Ref Range   Path Review     Assessment & Plan:   Problem List Items Addressed This Visit       Other   Acute cough - Primary    Patient presents with what sounds like viral illness.  Most symptoms have resolved minus the cough which is now starting to produce yellow sputum.  Patient's been using over-the-counter medications inclusive of Delsym today that seems to be helping symptom management.  Patient is concerned because he does have a history of pneumonia and does not milligrams in route again.  Given that symptoms are improving minus cough with some production did discuss patient I do not think antibiotic is needed at this time.  He does know that if he continues to not improve or declines over the next few days to reach out via MyChart we can always consider antibiotic use.  We will send in some Tessalon Perles for as needed use he can either use his Delsym or my Ladona Ridgel he is aware of this.  Continue monitoring.      Relevant Medications   benzonatate (TESSALON) 200 MG capsule     No orders of the defined types were placed in this encounter.  No orders of the defined types were placed in this encounter.   I discussed the assessment and treatment plan with the patient. The patient was provided an opportunity to ask questions and all were answered. The patient agreed with the plan and demonstrated an understanding of the instructions. The patient was advised to call back or seek an in-person evaluation if the symptoms worsen or if the condition fails to improve as anticipated.  Follow up plan: No follow-ups on file.  [name], NP

## 2021-09-11 ENCOUNTER — Telehealth: Payer: Self-pay | Admitting: Family Medicine

## 2021-09-11 NOTE — Telephone Encounter (Signed)
Called patient and he states that he has used Delsym and [employer] medication it helps some but not a lot, mainly dry cough that is constant, very rare that he coughs up anything. He is just fatigue from coughing so much. He has noticed rattling in his chest but can not get it to come up. No other symptoms. No chest pain or tightness or wheezing, no fever or any head congestion.

## 2021-09-11 NOTE — Telephone Encounter (Signed)
Pt had virtual appt with [name] on 10/31, his cough has not improved.

## 2021-09-11 NOTE — Telephone Encounter (Signed)
Can we gather more information about the cough. I did stay if he did not start improving we will consider an abx

## 2021-09-12 NOTE — Telephone Encounter (Signed)
Called patient with no answer. Did leave a voice message for him to return my call

## 2021-09-15 ENCOUNTER — Other Ambulatory Visit: Payer: 59

## 2021-09-15 NOTE — Telephone Encounter (Signed)
Called and spoke with patient. He was concerned that we were not worried about him cause he called on Thursday. I did mention to him that the messages have to be sent to me and me review them. I did call Friday and left a voice message.  Patient states that he is slowly improving with the cough. He did mention that he had an appointment with Dr. Glori Bickers next week for his CPE.  Continue to monitor follow up as scheduled.

## 2021-09-22 ENCOUNTER — Encounter: Payer: Self-pay | Admitting: Family Medicine

## 2021-09-22 ENCOUNTER — Ambulatory Visit (INDEPENDENT_AMBULATORY_CARE_PROVIDER_SITE_OTHER): Payer: 59 | Admitting: Family Medicine

## 2021-09-22 ENCOUNTER — Other Ambulatory Visit: Payer: Self-pay

## 2021-09-22 VITALS — BP 128/78 | HR 73 | Temp 97.9°F | Ht 70.0 in | Wt 260.5 lb

## 2021-09-22 DIAGNOSIS — Z1211 Encounter for screening for malignant neoplasm of colon: Secondary | ICD-10-CM | POA: Diagnosis not present

## 2021-09-22 DIAGNOSIS — I10 Essential (primary) hypertension: Secondary | ICD-10-CM

## 2021-09-22 DIAGNOSIS — Z23 Encounter for immunization: Secondary | ICD-10-CM | POA: Diagnosis not present

## 2021-09-22 DIAGNOSIS — Z6837 Body mass index (BMI) 37.0-37.9, adult: Secondary | ICD-10-CM

## 2021-09-22 DIAGNOSIS — Z0000 Encounter for general adult medical examination without abnormal findings: Secondary | ICD-10-CM | POA: Diagnosis not present

## 2021-09-22 MED ORDER — LOSARTAN POTASSIUM 100 MG PO TABS: 50.0000 mg | ORAL_TABLET | Freq: Every day | ORAL | 3 refills | Status: DC

## 2021-09-22 MED ORDER — AMLODIPINE BESYLATE 5 MG PO TABS: 5.0000 mg | ORAL_TABLET | Freq: Every day | ORAL | 3 refills | Status: DC

## 2021-09-22 NOTE — Patient Instructions (Signed)
Take care of yourself   Wear sunscreen  Drink water Flu shot today   I referred you for a colonoscopy for colon cancer screening (if insurance covers it  You will get a call   Eat a balance diet   Try to get most of your carbohydrates from produce (with the exception of white potatoes)  Eat less bread/pasta/rice/snack foods/cereals/sweets and other items from the middle of the grocery store (processed carbs)

## 2021-09-22 NOTE — Assessment & Plan Note (Signed)
D/w patient TV:[id number] for colon cancer screening, including IFOB vs. colonoscopy.  Risks and benefits of both were discussed and patient voiced understanding.  Pt elects for: colonoscopy (if covered by insurance)

## 2021-09-22 NOTE — Progress Notes (Signed)
Subjective:    Patient ID: Raymond West, male    DOB: 10-15-1976, 45 y.o.   MRN: [id number]  This visit occurred during the SARS-CoV-2 public health emergency.  Safety protocols were in place, including screening questions prior to the visit, additional usage of staff PPE, and extensive cleaning of exam room while observing appropriate contact time as indicated for disinfecting solutions.   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    Covid vaccinated  Flu shot -had the flu last week/ thinks that was it was  Wants flu shot today  Tdap  up to date 2020   Prostate health  No problems No family history  No nocturia   Colon cancer screening  Interested in colonoscopy  Mood is good overall  Depression screen [facility] 2/9 09/22/2021 09/20/2020 09/19/2019 08/26/2018 07/07/2017  Decreased Interest 0 0 0 0 0  Down, Depressed, Hopeless 0 0 0 0 0  PHQ - 2 Score 0 0 0 0 0  Altered sleeping 0 0 0 - -  Tired, decreased energy 0 2 1 - -  Change in appetite 0 0 0 - -  Feeling bad or failure about yourself  0 0 0 - -  Trouble concentrating 0 0 0 - -  Moving slowly or fidgety/restless 0 0 0 - -  Suicidal thoughts 0 0 0 - -  PHQ-9 Score 0 2 1 - -  Difficult doing work/chores Not difficult at all Not difficult at all Not difficult at all - -     HTN bp is stable today  No cp or palpitations or headaches or edema  No side effects to medicines  BP Readings from Last 3 Encounters:  09/22/21 128/78  09/20/20 130/76  11/02/19 120/74    Losartan 100 mg 1/2 pill daily  Amlodipine 5 mg daily   Dermatology - goes yearly  Dr Nehemiah Massed No skin cancers this check   Wears lots of sunscreen    Patient Active Problem List   Diagnosis Date Noted   Colon cancer screening 09/22/2021   H/O: pneumonia 03/18/2018   Class 2 obesity due to excess calories with body mass index (BMI) of 37.0 to 37.9 in adult 06/26/2016   Routine general medical examination at a health care facility 05/27/2012   Essential hypertension 01/15/2010   Past Medical History:  Diagnosis Date   Dysplastic nevus 04/02/2010   right upper back, 3.0 cm lat to spine, moderate to severe atypia    Neoplasm of uncertain behavior of skin    Unspecified essential hypertension    History reviewed. No pertinent surgical history. Social History   Tobacco Use   Smoking status: Never   Smokeless tobacco: Never  Substance Use Topics   Alcohol use: Yes    Alcohol/week: 0.0 standard drinks    Comment: rarely   Drug use: No   Family History  Problem Relation Age of Onset   Leukemia Mother    Diabetes Mother    Kidney disease Father        has been on dialysis for years   Diabetes Father        ? -  unsure   Hypertension Father    Neuropathy Father    Gout Father    Arthritis Maternal Grandfather    Arthritis Maternal Grandmother    Arthritis Paternal Grandfather    Arthritis Paternal Grandmother    Allergies  Allergen Reactions   Amoxicillin     REACTION: rash   Benazepril Cough   Keflex [Cephalexin]     Rash    Penicillins Rash   Current Outpatient Medications on File Prior to Visit  Medication Sig Dispense Refill   ketoconazole (NIZORAL) 2 % cream Apply to feet and in between toes nightly. 60 g 11   loratadine (CLARITIN) 10 MG tablet Take 1 tablet (10 mg total) by mouth daily as needed for allergies. 90 tablet 2   No current facility-administered medications on file prior to visit.    Review of Systems  Constitutional:  Negative for activity change, appetite change, fatigue, fever and unexpected weight change.  HENT:  Negative for congestion, rhinorrhea, sore throat and trouble swallowing.    Eyes:  Negative for pain, redness, itching and visual disturbance.  Respiratory:  Negative for cough, chest tightness, shortness of breath and wheezing.   Cardiovascular:  Negative for chest pain and palpitations.  Gastrointestinal:  Negative for abdominal pain, blood in stool, constipation, diarrhea and nausea.  Endocrine: Negative for cold intolerance, heat intolerance, polydipsia and polyuria.  Genitourinary:  Negative for difficulty urinating, dysuria, frequency and urgency.  Musculoskeletal:  Negative for arthralgias, joint swelling and myalgias.  Skin:  Negative for pallor and rash.  Neurological:  Negative for dizziness, tremors, weakness, numbness and headaches.  Hematological:  Negative for adenopathy. Does not bruise/bleed easily.  Psychiatric/Behavioral:  Negative for decreased concentration and dysphoric mood. The patient is not nervous/anxious.       Objective:   Physical Exam Constitutional:      General: He is not in acute distress.    Appearance: Normal appearance. He is well-developed. He is obese. He is not ill-appearing or diaphoretic.  HENT:     Head: Normocephalic and atraumatic.     Right Ear: Tympanic membrane, ear canal and external ear normal.     Left Ear: Tympanic membrane, ear canal and external ear normal.     Nose: Nose normal. No congestion.     Mouth/Throat:     Mouth: Mucous membranes are moist.     Pharynx: Oropharynx is clear. No posterior oropharyngeal erythema.  Eyes:     General: No scleral icterus.       Right eye: No discharge.        Left eye: No discharge.     Conjunctiva/sclera: Conjunctivae normal.     Pupils: Pupils are equal, round, and reactive to light.  Neck:     Thyroid: No thyromegaly.     Vascular: No carotid bruit or JVD.  Cardiovascular:     Rate and Rhythm: Normal rate and regular rhythm.     Pulses: Normal pulses.     Heart sounds: Normal heart sounds.    No gallop.  Pulmonary:     Effort: Pulmonary effort is normal.  No respiratory distress.     Breath sounds: Normal breath sounds. No wheezing or rales.     Comments: Good air exch Chest:     Chest wall: No tenderness.  Abdominal:     General: Bowel sounds are normal. There is no distension or abdominal bruit.     Palpations: Abdomen is soft. There is no mass.     Tenderness: There  is no abdominal tenderness.     Hernia: No hernia is present.  Musculoskeletal:        General: No tenderness.     Cervical back: Normal range of motion and neck supple. No rigidity. No muscular tenderness.     Right lower leg: No edema.     Left lower leg: No edema.     Comments:    Lymphadenopathy:     Cervical: No cervical adenopathy.  Skin:    General: Skin is warm and dry.     Coloration: Skin is not pale.     Findings: No erythema or rash.     Comments: Fair complexion  Solar lentigines diffusely  Neurological:     Mental Status: He is alert.     Cranial Nerves: No cranial nerve deficit.     Motor: No abnormal muscle tone.     Coordination: Coordination normal.     Gait: Gait normal.     Deep Tendon Reflexes: Reflexes are normal and symmetric. Reflexes normal.  Psychiatric:        Mood and Affect: Mood normal.        Cognition and Memory: Cognition and memory normal.     Comments: Pleasant           Assessment & Plan:   Problem List Items Addressed This Visit       Cardiovascular and Mediastinum   Essential hypertension    bp in fair control at this time  BP Readings from Last 1 Encounters:  09/22/21 128/78  No changes needed Most recent labs reviewed  Disc lifstyle change with low sodium diet and exercise  Plan to continue losartan 50 mg daily and amlodipine 5 mg daily  Labs ordered      Relevant Medications   amLODipine (NORVASC) 5 MG tablet   losartan (COZAAR) 100 MG tablet   Other Relevant Orders   CBC with Differential/Platelet   Comprehensive metabolic panel   Lipid panel   TSH     Other   Routine general medical  examination at a health care facility - Primary    Reviewed health habits including diet and exercise and skin cancer prevention Reviewed appropriate screening tests for age  Also reviewed health mt list, fam hx and immunization status , as well as social and family history   See HPI Labs ordered Flu shot given  Ref for screening colonoscopy  utd dermatology care, also uses sunscreen Encouraged more veg/fruit in diet       Class 2 obesity due to excess calories with body mass index (BMI) of 37.0 to 37.9 in adult    Discussed how this problem influences overall health and the risks it imposes  Reviewed plan for weight loss with lower calorie diet (via better food choices and also portion control or program like weight watchers) and exercise building up to or more than 30 minutes 5 days per week including some aerobic activity         Colon cancer screening    D/w patient [id number] for colon cancer screening, including IFOB vs. colonoscopy.  Risks and benefits of both were discussed and patient voiced understanding.  Pt elects for: colonoscopy (if covered by insurance)       Relevant Orders   Ambulatory referral to Gastroenterology   Other Visit Diagnoses     Need for influenza vaccination       Relevant Orders   Flu Vaccine QUAD 6+ mos PF IM (Fluarix Quad PF) (Completed)

## 2021-09-22 NOTE — Assessment & Plan Note (Signed)
Discussed how this problem influences overall health and the risks it imposes  Reviewed plan for weight loss with lower calorie diet (via better food choices and also portion control or program like weight watchers) and exercise building up to or more than 30 minutes 5 days per week including some aerobic activity    

## 2021-09-22 NOTE — Assessment & Plan Note (Signed)
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

## 2021-09-22 NOTE — Assessment & Plan Note (Signed)
bp in fair control at this time  BP Readings from Last 1 Encounters:  09/22/21 128/78   No changes needed Most recent labs reviewed  Disc lifstyle change with low sodium diet and exercise  Plan to continue losartan 50 mg daily and amlodipine 5 mg daily  Labs ordered

## 2021-09-23 LAB — COMPREHENSIVE METABOLIC PANEL
ALT: 36 U/L (ref 0–53)
AST: 26 U/L (ref 0–37)
Albumin: 4.5 g/dL (ref 3.5–5.2)
Alkaline Phosphatase: 60 U/L (ref 39–117)
BUN: 20 mg/dL (ref 6–23)
CO2: 30 mEq/L (ref 19–32)
Calcium: 9.6 mg/dL (ref 8.4–10.5)
Chloride: 103 mEq/L (ref 96–112)
Creatinine, Ser: 1 mg/dL (ref 0.40–1.50)
GFR: 90.75 mL/min (ref >60.00)
Glucose, Bld: 95 mg/dL (ref 70–99)
Potassium: 4.3 mEq/L (ref 3.5–5.1)
Sodium: 141 mEq/L (ref 135–145)
Total Bilirubin: 0.7 mg/dL (ref 0.2–1.2)
Total Protein: 6.8 g/dL (ref 6.0–8.3)

## 2021-09-23 LAB — CBC WITH DIFFERENTIAL/PLATELET
Basophils Absolute: 0.1 10*3/uL (ref 0.0–0.1)
Basophils Relative: 1 % (ref 0.0–3.0)
Eosinophils Absolute: 0.3 10*3/uL (ref 0.0–0.7)
Eosinophils Relative: 2.6 % (ref 0.0–5.0)
HCT: 42.6 % (ref 39.0–52.0)
Hemoglobin: 14.1 g/dL (ref 13.0–17.0)
Lymphocytes Relative: 29.6 % (ref 12.0–46.0)
Lymphs Abs: 2.9 10*3/uL (ref 0.7–4.0)
MCHC: 33.2 g/dL (ref 30.0–36.0)
MCV: 86.6 fl (ref 78.0–100.0)
Monocytes Absolute: 0.8 10*3/uL (ref 0.1–1.0)
Monocytes Relative: 8.2 % (ref 3.0–12.0)
Neutro Abs: 5.8 10*3/uL (ref 1.4–7.7)
Neutrophils Relative %: 58.6 % (ref 43.0–77.0)
Platelets: 228 10*3/uL (ref 150.0–400.0)
RBC: 4.92 Mil/uL (ref 4.22–5.81)
RDW: 13.1 % (ref 11.5–15.5)
WBC: 9.9 10*3/uL (ref 4.0–10.5)

## 2021-09-23 LAB — LIPID PANEL
Cholesterol: 173 mg/dL (ref 0–200)
HDL: 36.9 mg/dL — ABNORMAL LOW (ref >39.00)
LDL Cholesterol: 99 mg/dL (ref 0–99)
NonHDL: 135.93
Total CHOL/HDL Ratio: 5
Triglycerides: 185 mg/dL — ABNORMAL HIGH (ref 0.0–149.0)
VLDL: 37 mg/dL (ref 0.0–40.0)

## 2021-09-23 LAB — TSH: TSH: 2.05 u[IU]/mL (ref 0.35–5.50)

## 2021-09-28 ENCOUNTER — Other Ambulatory Visit: Payer: Self-pay | Admitting: Family Medicine

## 2021-10-15 ENCOUNTER — Encounter: Payer: Self-pay | Admitting: Family Medicine

## 2021-10-15 NOTE — Telephone Encounter (Signed)
The patient needs to contact [facility] to schedule his Colonoscopy - unfortunately they are not going to have any appointments before the end of the year. We do not schedule Colonoscopies as they are a part of Routine Annual Work up (unless urgently needed)  Also we do not have anything to do with insurance coverage for colonoscopies. If insurance verification is needed this is done by the Gastroenterology office at the time they schedule the Colonoscopy OR the patient will need to contact his insurance to discuss them.   We have been asked by [facility] to have the patient contact them regarding scheduling needs.   I have sent his information to the patient as well in Mychart

## 2021-10-15 NOTE — Telephone Encounter (Signed)
Will route to referral pool to check status of GI referral that was placed last month

## 2021-12-02 ENCOUNTER — Other Ambulatory Visit: Payer: Self-pay | Admitting: Family Medicine

## 2022-01-01 ENCOUNTER — Encounter: Payer: Self-pay | Admitting: Gastroenterology

## 2022-01-16 ENCOUNTER — Ambulatory Visit (INDEPENDENT_AMBULATORY_CARE_PROVIDER_SITE_OTHER): Payer: BC Managed Care – PPO | Admitting: Gastroenterology

## 2022-01-16 ENCOUNTER — Encounter: Payer: Self-pay | Admitting: Gastroenterology

## 2022-01-16 VITALS — BP 130/80 | HR 70 | Ht 72.0 in | Wt 269.0 lb

## 2022-01-16 DIAGNOSIS — R1031 Right lower quadrant pain: Secondary | ICD-10-CM | POA: Diagnosis not present

## 2022-01-16 DIAGNOSIS — Z1211 Encounter for screening for malignant neoplasm of colon: Secondary | ICD-10-CM

## 2022-01-16 DIAGNOSIS — R12 Heartburn: Secondary | ICD-10-CM | POA: Diagnosis not present

## 2022-01-16 DIAGNOSIS — R140 Abdominal distension (gaseous): Secondary | ICD-10-CM

## 2022-01-16 MED ORDER — NA SULFATE-K SULFATE-MG SULF 17.5-3.13-1.6 GM/177ML PO SOLN: 1.0000 | Freq: Once | ORAL | 0 refills | Status: AC

## 2022-01-16 NOTE — Patient Instructions (Signed)
If you are age 46 or older, your body mass index should be between 23-30. Your Body mass index is 36.48 kg/m. If this is out of the aforementioned range listed, please consider follow up with your Primary Care Provider.  If you are age 46 or younger, your body mass index should be between 19-25. Your Body mass index is 36.48 kg/m. If this is out of the aformentioned range listed, please consider follow up with your Primary Care Provider.   ________________________________________________________  The [facility] providers would like to encourage you to use [facility] to communicate with providers for non-urgent requests or questions.  Due to long hold times on the telephone, sending your provider a message by [facility] may be a faster and more efficient way to get a response.  Please allow 48 business hours for a response.  Please remember that this is for non-urgent requests.  _______________________________________________________   Dennis Bast have been scheduled for a colonoscopy. Please follow written instructions given to you at your visit today.  Please pick up your prep supplies at the pharmacy within the next 1-3 days. If you use inhalers (even only as needed), please bring them with you on the day of your procedure.  _______________________________________________________  Food Guidelines for those with chronic digestive trouble:  Many people have difficulty digesting certain foods, causing a variety of distressing and embarrassing symptoms such as abdominal pain, bloating and gas.  These foods may need to be avoided or consumed in small amounts.  Here are some tips that might be helpful for you.  1.   Lactose intolerance is the difficulty or complete inability to digest lactose, the natural sugar in milk and anything made from milk.  This condition is harmless, common, and can begin any time during life.  Some people can digest a modest amount of lactose while others cannot tolerate any.  Also,  not all dairy products contain equal amounts of lactose.  For example, hard cheeses such as parmesan have less lactose than soft cheeses such as cheddar.  Yogurt has less lactose than milk or cheese.  Many packaged foods (even many brands of bread) have milk, so read ingredient lists carefully.  It is difficult to test for lactose intolerance, so just try avoiding lactose as much as possible for a week and see what happens with your symptoms.  If you seem to be lactose intolerant, the best plan is to avoid it (but make sure you get calcium from another source).  The next best thing is to use lactase enzyme supplements, available over the counter everywhere.  Just know that many lactose intolerant people need to take several tablets with each serving of dairy to avoid symptoms.  Lastly, a lot of restaurant food is made with milk or butter.  Many are things you might not suspect, such as mashed potatoes, rice and pasta (cooked with butter) and "grilled" items.  If you are lactose intolerant, it never hurts to ask your server what has milk or butter.  2.   Fiber is an important part of your diet, but not all fiber is well-tolerated.  Insoluble fiber such as bran is often consumed by normal gut bacteria and converted into gas.  Soluble fiber such as oats, squash, carrots and green beans are typically tolerated better.  3.   Some types of carbohydrates can be poorly digested.  Examples include: fructose (apples, cherries, pears, raisins and other dried fruits), fructans (onions, zucchini, large amounts of wheat), sorbitol/mannitol/xylitol and sucralose/Splenda (common artificial  sweeteners), and raffinose (lentils, broccoli, cabbage, asparagus, brussel sprouts, many types of beans).  Do a Development worker, community for [employer] and you will find helpful information. Beano, a dietary supplement, will often help with raffinose-containing foods.  As with lactase tablets, you may need several per serving.  4.   Whenever possible,  avoid processed food&meats and chemical additives.  High fructose corn syrup, a common sweetener, may be difficult to digest.  Eggs and soy (comes from the soybean, and added to many foods now) are other common bloating/gassy foods.  5.  Regarding gluten:  gluten is a protein mainly found in wheat, but also rye and barley.  There is a condition called celiac sprue, which is an inflammatory reaction in the small intestine causing a variety of digestive symptoms.  Blood testing is highly reliable to look for this condition, and sometimes upper endoscopy with small bowel biopsies may be necessary to make the diagnosis.  Many patients who test negative for celiac sprue report improvement in their digestive symptoms when they switch to a gluten-free diet.  However, in these "non-celiac gluten sensitive" patients, the true role of gluten in their symptoms is unclear.  Reducing carbohydrates in general may decrease the gas and bloating caused when gut bacteria consume carbs. Also, some of these patients may actually be intolerant of the baker's yeast in bread products rather than the gluten.  Flatbread and other reduced yeast breads might therefore be tolerated.  There is no specific testing available for most food intolerances, which are discovered mainly by dietary elimination.  Please do not embark on a gluten free diet unless directed by your doctor, as it is highly restrictive, and may lead to nutritional deficiencies if not carefully monitored.  Lastly, beware of internet claims offering "personalized" tests for food intolerances.  Such testing has no reliable scientific evidence to support its reliability and correlation to symptoms.    6.  The best advice is old advice, especially for those with chronic digestive trouble - try to eat "clean".  Balanced diet, avoid processed food, plenty of fruits and vegetables, cut down the sugar, minimal alcohol, avoid tobacco. Make time to care for yourself, get enough  sleep, exercise when you can, reduce stress.  Your guts will thank you for it.   - Dr. [facility]  ____________________________________________________________    Food Choices for Gastroesophageal Reflux Disease, Adult When you have gastroesophageal reflux disease (GERD), the foods you eat and your eating habits are very important. Choosing the right foods can help ease your discomfort. Think about working with a food expert (dietitian) to help you make good choices. What are tips for following this plan? Reading food labels Look for foods that are low in saturated fat. Foods that may help with your symptoms include: Foods that have less than 5% of daily value (DV) of fat. Foods that have 0 grams of trans fat. Cooking Do not fry your food. Cook your food by baking, steaming, grilling, or broiling. These are all methods that do not need a lot of fat for cooking. To add flavor, try to use herbs that are low in spice and acidity. Meal planning  Choose healthy foods that are low in fat, such as: Fruits and vegetables. Whole grains. Low-fat dairy products. Lean meats, fish, and poultry. Eat small meals often instead of eating 3 large meals each day. Eat your meals slowly in a place where you are relaxed. Avoid bending over or lying down until 2-3 hours  after eating. Limit high-fat foods such as fatty meats or fried foods. Limit your intake of fatty foods, such as oils, butter, and shortening. Avoid the following as told by your doctor: Foods that cause symptoms. These may be different for different people. Keep a food diary to keep track of foods that cause symptoms. Alcohol. Drinking a lot of liquid with meals. Eating meals during the 2-3 hours before bed. Lifestyle Stay at a healthy weight. Ask your doctor what weight is healthy for you. If you need to lose weight, work with your doctor to do so safely. Exercise for at least 30 minutes on 5 or more days  each week, or as told by your doctor. Wear loose-fitting clothes. Do not smoke or use any products that contain nicotine or tobacco. If you need help quitting, ask your doctor. Sleep with the head of your bed higher than your feet. Use a wedge under the mattress or blocks under the bed frame to raise the head of the bed. Chew sugar-free gum after meals. What foods should eat? Eat a healthy, well-balanced diet of fruits, vegetables, whole grains, low-fat dairy products, lean meats, fish, and poultry. Each person is different. Foods that may cause symptoms in one person may not cause any symptoms in another person. Work with your doctor to find foods that are safe for you. The items listed above may not be a complete list of what you can eat and drink. Contact a food expert for more options. What foods should I avoid? Limiting some of these foods may help in managing the symptoms of GERD. Everyone is different. Talk with a food expert or your doctor to help you find the exact foods to avoid, if any. Fruits Any fruits prepared with added fat. Any fruits that cause symptoms. For some people, this may include citrus fruits, such as oranges, grapefruit, pineapple, and lemons. Vegetables Deep-fried vegetables. [place] fries. Any vegetables prepared with added fat. Any vegetables that cause symptoms. For some people, this may include tomatoes and tomato products, chili peppers, onions and garlic, and horseradish. Grains Pastries or quick breads with added fat. Meats and other proteins High-fat meats, such as fatty beef or pork, hot dogs, ribs, ham, sausage, salami, and bacon. Fried meat or protein, including fried fish and fried chicken. Nuts and nut butters, in large amounts. Dairy Whole milk and chocolate milk. Sour cream. Cream. Ice cream. Cream cheese. Milkshakes. Fats and oils Butter. Margarine. Shortening. Ghee. Beverages Coffee and tea, with or without caffeine. Carbonated beverages. Sodas.  Energy drinks. Fruit juice made with acidic fruits, such as orange or grapefruit. Tomato juice. Alcoholic drinks. Sweets and desserts Chocolate and cocoa. Donuts. Seasonings and condiments Pepper. Peppermint and spearmint. Added salt. Any condiments, herbs, or seasonings that cause symptoms. For some people, this may include curry, hot sauce, or vinegar-based salad dressings. The items listed above may not be a complete list of what you should not eat and drink. Contact a food expert for more options. Questions to ask your doctor Diet and lifestyle changes are often the first steps that are taken to manage symptoms of GERD. If diet and lifestyle changes do not help, talk with your doctor about taking medicines. Where to find more information International [employer] for Gastrointestinal Disorders: aboutgerd.org Summary When you have GERD, food and lifestyle choices are very important in easing your symptoms. Eat small meals often instead of 3 large meals a day. Eat your meals slowly and in a place where you are relaxed.  Avoid bending over or lying down until 2-3 hours after eating. Limit high-fat foods such as fatty meats or fried foods. This information is not intended to replace advice given to you by your health care provider. Make sure you discuss any questions you have with your health care provider. Document Revised: 05/06/2020 Document Reviewed: 05/06/2020 Elsevier Patient Education  [place]. Gastroesophageal Reflux Disease, Adult Gastroesophageal reflux (GER) happens when acid from the stomach flows up into the tube that connects the mouth and the stomach (esophagus). Normally, food travels down the esophagus and stays in the stomach to be digested. With GER, food and stomach acid sometimes move back up into the esophagus. You may have a disease called gastroesophageal reflux disease (GERD) if the reflux: Happens often. Causes frequent or very bad symptoms. Causes problems  such as damage to the esophagus. When this happens, the esophagus becomes sore and swollen. Over time, GERD can make small holes (ulcers) in the lining of the esophagus. What are the causes? This condition is caused by a problem with the muscle between the esophagus and the stomach. When this muscle is weak or not normal, it does not close properly to keep food and acid from coming back up from the stomach. The muscle can be weak because of: Tobacco use. Pregnancy. Having a certain type of hernia (hiatal hernia). Alcohol use. Certain foods and drinks, such as coffee, chocolate, onions, and peppermint. What increases the risk? Being overweight. Having a disease that affects your connective tissue. Taking NSAIDs, such a ibuprofen. What are the signs or symptoms? Heartburn. Difficult or painful swallowing. The feeling of having a lump in the throat. A bitter taste in the mouth. Bad breath. Having a lot of saliva. Having an upset or bloated stomach. Burping. Chest pain. Different conditions can cause chest pain. Make sure you see your doctor if you have chest pain. Shortness of breath or wheezing. A long-term cough or a cough at night. Wearing away of the surface of teeth (tooth enamel). Weight loss. How is this treated? Making changes to your diet. Taking medicine. Having surgery. Treatment will depend on how bad your symptoms are. Follow these instructions at home: Eating and drinking  Follow a diet as told by your doctor. You may need to avoid foods and drinks such as: Coffee and tea, with or without caffeine. Drinks that contain alcohol. Energy drinks and sports drinks. Bubbly (carbonated) drinks or sodas. Chocolate and cocoa. Peppermint and mint flavorings. Garlic and onions. Horseradish. Spicy and acidic foods. These include peppers, chili powder, curry powder, vinegar, hot sauces, and BBQ sauce. Citrus fruit juices and citrus fruits, such as oranges, lemons, and  limes. Tomato-based foods. These include red sauce, chili, salsa, and pizza with red sauce. Fried and fatty foods. These include donuts, french fries, potato chips, and high-fat dressings. High-fat meats. These include hot dogs, rib eye steak, sausage, ham, and bacon. High-fat dairy items, such as whole milk, butter, and cream cheese. Eat small meals often. Avoid eating large meals. Avoid drinking large amounts of liquid with your meals. Avoid eating meals during the 2-3 hours before bedtime. Avoid lying down right after you eat. Do not exercise right after you eat. Lifestyle  Do not smoke or use any products that contain nicotine or tobacco. If you need help quitting, ask your doctor. Try to lower your stress. If you need help doing this, ask your doctor. If you are overweight, lose an amount of weight that is healthy for you. Ask  your doctor about a safe weight loss goal. General instructions Pay attention to any changes in your symptoms. Take over-the-counter and prescription medicines only as told by your doctor. Do not take aspirin, ibuprofen, or other NSAIDs unless your doctor says it is okay. Wear loose clothes. Do not wear anything tight around your waist. Raise (elevate) the head of your bed about 6 inches (15 cm). You may need to use a wedge to do this. Avoid bending over if this makes your symptoms worse. Keep all follow-up visits. Contact a doctor if: You have new symptoms. You lose weight and you do not know why. You have trouble swallowing or it hurts to swallow. You have wheezing or a cough that keeps happening. You have a hoarse voice. Your symptoms do not get better with treatment. Get help right away if: You have sudden pain in your arms, neck, jaw, teeth, or back. You suddenly feel sweaty, dizzy, or light-headed. You have chest pain or shortness of breath. You vomit and the vomit is green, yellow, or black, or it looks like blood or coffee grounds. You  faint. Your poop (stool) is red, bloody, or black. You cannot swallow, drink, or eat. These symptoms may represent a serious problem that is an emergency. Do not wait to see if the symptoms will go away. Get medical help right away. Call your local emergency services (911 in the [place] Do not drive yourself to the hospital. Summary If a person has gastroesophageal reflux disease (GERD), food and stomach acid move back up into the esophagus and cause symptoms or problems such as damage to the esophagus. Treatment will depend on how bad your symptoms are. Follow a diet as told by your doctor. Take all medicines only as told by your doctor. This information is not intended to replace advice given to you by your health care provider. Make sure you discuss any questions you have with your health care provider. Document Revised: 05/06/2020 Document Reviewed: 05/06/2020 Elsevier Patient Education  [place].

## 2022-01-16 NOTE — Progress Notes (Signed)
[facility] Consult Note:  History: Raymond West 01/16/2022  Referring provider: Abner Greenspan, MD  Reason for consult/chief complaint: RLQ discomfort (Onset end of 2022 on/off, gas , bloated, no nausea, no vomiting, at times feels like he has no strength. ) and Gastroesophageal Reflux (Bothers him on/off)   Subjective  HPI:  This is a very pleasant 46 year old man referred by primary care for colon cancer screening and brought to the office with reported symptoms. 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  He notices it more with certain foods such as those with tomato sauce.  Denies dysphagia, odynophagia, loss of appetite, nausea vomiting, early satiety or weight loss. He has intermittent bloating and a "noisy stomach".  BMs are 1-2 times daily, he has noticed in the last couple of years they are softer than they once were.  He denies rectal bleeding or pain with bowel movements. He is generally more fatigued with aches and pains at the end of the workday, as often in crawl spaces and other tight spots doing HVAC work.  He has had some intermittent pain reportedly in the right lower quadrant, though on description it is the right groin.  It does not radiate anywhere, no testicular pain.   ROS:  Review of Systems  Constitutional:  Negative for appetite change and unexpected weight change.  HENT:  Negative for mouth sores and voice change.   Eyes:  Negative for pain and redness.  Respiratory:  Negative for cough and shortness of breath.   Cardiovascular:  Negative for chest pain and palpitations.  Genitourinary:  Negative for dysuria and hematuria.  Musculoskeletal:  Negative for arthralgias and myalgias.  Skin:  Negative for pallor and rash.  Neurological:  Negative for weakness and headaches.  Hematological:   Negative for adenopathy.    Past Medical History: Past Medical History:  Diagnosis Date   Dysplastic nevus 04/02/2010   right upper back, 3.0 cm lat to spine, moderate to severe atypia    Neoplasm of uncertain behavior of skin    Unspecified essential hypertension   Last primary care office note of 09/22/2021 was reviewed.   Past Surgical History: Past Surgical History:  Procedure Laterality Date   NO PAST SURGERIES       Family History: Family History  Problem Relation Age of Onset   Leukemia Mother    Diabetes Mother    Kidney disease Father        has been on dialysis for years   Diabetes Father    Hypertension Father    Neuropathy Father    Gout Father    Arthritis Maternal Grandmother    Arthritis Maternal Grandfather    Arthritis Paternal Grandmother    Arthritis Paternal Grandfather    Heart attack Paternal Grandfather        smoker   Colon cancer Neg Hx    Esophageal cancer Neg Hx     Social History: Social History   Socioeconomic History   Marital status: Married    Spouse name: Not on file   Number of children: 1   Years of education: Not on file   Highest education level: Not on file  Occupational History   Occupation: Artist  Tobacco Use   Smoking status: Never   Smokeless tobacco: Never  Vaping Use   Vaping Use:  Never used  Substance and Sexual Activity   Alcohol use: Yes    Alcohol/week: 0.0 standard drinks    Comment: rarely   Drug use: No   Sexual activity: Not on file  Other Topics Concern   Not on file  Social History Narrative   Raymond West is patient of [facility]      Never smoked      No alcohol use      1 year college      Scientist, physiological         Social Determinants of Health   Financial Resource Strain: Not on file  Food Insecurity: Not on file  Transportation Needs: Not on file  Physical Activity: Not on file  Stress: Not on file  Social Connections: Not on file     Allergies: Allergies  Allergen Reactions   Amoxicillin     REACTION: rash   Benazepril Cough   Keflex [Cephalexin]     Rash    Penicillins Rash    Outpatient Meds: Current Outpatient Medications  Medication Sig Dispense Refill   amLODipine (NORVASC) 5 MG tablet Take 1 tablet (5 mg total) by mouth daily. 90 tablet 3   ketoconazole (NIZORAL) 2 % cream Apply to feet and in between toes nightly. (Patient taking differently: Apply to feet and in between toes nightly prn) 60 g 11   loratadine (CLARITIN) 10 MG tablet Take 1 tablet (10 mg total) by mouth daily as needed for allergies. 90 tablet 2   losartan (COZAAR) 100 MG tablet Take 0.5 tablets (50 mg total) by mouth daily. 45 tablet 3   No current facility-administered medications for this visit.      ___________________________________________________________________ Objective   Exam:  BP 130/80    Pulse 70    Ht 6' (1.829 m)    Wt 269 lb (122 kg)    BMI 36.48 kg/m  Wt Readings from Last 3 Encounters:  01/16/22 269 lb (122 kg)  09/22/21 260 lb 8 oz (118.2 kg)  09/20/20 260 lb 1 oz (118 kg)    General: Overweight, well-appearing, normal vocal quality. Eyes: sclera anicteric, no redness ENT: oral mucosa moist without lesions, no cervical or supraclavicular lymphadenopathy CV: RRR without murmur, S1/S2, no JVD, no peripheral edema Resp: clear to auscultation bilaterally, normal RR and effort noted GI: soft, no tenderness, with active bowel sounds. No guarding or palpable organomegaly noted.  He indicated the right inguinal region is where he had been experiencing some intermittent pain.  There is no abnormality of the overlying skin, no tenderness, swelling or palpable abnormality.  No swelling or tenderness of the scrotum. Skin; warm and dry, no rash or jaundice noted Neuro: awake, alert and oriented x 3. Normal gross motor function and fluent speech  Labs:  CBC Latest Ref Rng & Units 09/22/2021 11/13/2020 09/20/2020   WBC 4.0 - 10.5 K/uL 9.9 8.5 11.2(H)  Hemoglobin 13.0 - 17.0 g/dL 14.1 15.3 15.8  Hematocrit 39.0 - 52.0 % 42.6 45.3 45.4  Platelets 150.0 - 400.0 K/uL 228.0 213 241   CMP Latest Ref Rng & Units 09/22/2021 09/20/2020 09/19/2019  Glucose 70 - 99 mg/dL 95 88 96  BUN 6 - 23 mg/dL '20 17 20  '$ Creatinine 0.40 - 1.50 mg/dL 1.00 0.93 1.00  Sodium 135 - 145 mEq/L 141 141 140  Potassium 3.5 - 5.1 mEq/L 4.3 4.5 4.0  Chloride 96 - 112 mEq/L 103 103 105  CO2 19 - 32 mEq/L '30 26 25  '$ Calcium 8.4 -  10.5 mg/dL 9.6 9.8 9.5  Total Protein 6.0 - 8.3 g/dL 6.8 7.0 7.1  Total Bilirubin 0.2 - 1.2 mg/dL 0.7 0.8 0.7  Alkaline Phos 39 - 117 U/L 60 - 60  AST 0 - 37 U/L '26 28 25  '$ ALT 0 - 53 U/L 36 37 33   Lab Results  Component Value Date   TSH 2.05 09/22/2021    Assessment: Encounter Diagnoses  Name Primary?   Abdominal bloating Yes   Heartburn    Groin pain, right    Special screening for malignant neoplasms, colon     Intermittent reflux, no red flag symptoms.  I do not feel he needs an upper endoscopy at this point. He has some diet and lifestyle things to work on for better reflux control, and I suspect some of it may be triggered by him often bending and crouching in the course of his work.  Abdominal bloating and gas, likely benign and often dietary related.  Some additional dietary guidelines given to him about this.  Right groin pain, musculoskeletal related to his work.  No hernia on exam  Average risk colorectal cancer, I recommended screening colonoscopy.  Diagram of the anatomy shown, procedure described in detail along with risks and benefits and he was agreeable.  The benefits and risks of the planned procedure were described in detail with the patient or (when appropriate) their health care proxy.  Risks were outlined as including, but not limited to, bleeding, infection, perforation, adverse medication reaction leading to cardiac or pulmonary decompensation, pancreatitis (if ERCP).  The  limitation of incomplete mucosal visualization was also discussed.  No guarantees or warranties were given.  Thank you for the courtesy of this consult.  Please call me with any questions or concerns.  Nelida Meuse III  CC: Referring provider noted above

## 2022-01-29 ENCOUNTER — Other Ambulatory Visit: Payer: Self-pay

## 2022-01-29 ENCOUNTER — Ambulatory Visit (INDEPENDENT_AMBULATORY_CARE_PROVIDER_SITE_OTHER): Payer: BC Managed Care – PPO | Admitting: Dermatology

## 2022-01-29 DIAGNOSIS — M721 Knuckle pads: Secondary | ICD-10-CM | POA: Diagnosis not present

## 2022-01-29 DIAGNOSIS — B353 Tinea pedis: Secondary | ICD-10-CM | POA: Diagnosis not present

## 2022-01-29 DIAGNOSIS — D229 Melanocytic nevi, unspecified: Secondary | ICD-10-CM

## 2022-01-29 DIAGNOSIS — L578 Other skin changes due to chronic exposure to nonionizing radiation: Secondary | ICD-10-CM | POA: Diagnosis not present

## 2022-01-29 DIAGNOSIS — D1800 Hemangioma unspecified site: Secondary | ICD-10-CM

## 2022-01-29 DIAGNOSIS — L814 Other melanin hyperpigmentation: Secondary | ICD-10-CM

## 2022-01-29 DIAGNOSIS — Z1283 Encounter for screening for malignant neoplasm of skin: Secondary | ICD-10-CM

## 2022-01-29 DIAGNOSIS — L918 Other hypertrophic disorders of the skin: Secondary | ICD-10-CM

## 2022-01-29 DIAGNOSIS — L821 Other seborrheic keratosis: Secondary | ICD-10-CM

## 2022-01-29 NOTE — Progress Notes (Signed)
? ?Follow-Up Visit ?  ?Subjective  ?Raymond West is a 46 y.o. male who presents for the following: Annual Exam (Hx dysplastic nevus - patient has a bump on his L middle finger he would like checked. ). The patient presents for Total-Body Skin Exam (TBSE) for skin cancer screening and mole check.  The patient has spots, moles and lesions to be evaluated, some may be new or changing. ? ?The following portions of the chart were reviewed this encounter and updated as appropriate:  ? Tobacco  Allergies  Meds  Problems  Med Hx  Surg Hx  Fam Hx   ?  ?Review of Systems:  No other skin or systemic complaints except as noted in HPI or Assessment and Plan. ? ?Objective  ?Well appearing patient in no apparent distress; mood and affect are within normal limits. ? ?A full examination was performed including scalp, head, eyes, ears, nose, lips, neck, chest, axillae, abdomen, back, buttocks, bilateral upper extremities, bilateral lower extremities, hands, feet, fingers, toes, fingernails, and toenails. All findings within normal limits unless otherwise noted below. ? ?B/L foot ?Scale ? ?L 3rd finger ?Papule  ? ? ? ? ? ?Assessment & Plan  ?Tinea pedis of left foot ?B/L foot ?Chronic and persistent condition with duration or expected duration over one year. Condition is symptomatic / bothersome to patient. Not to goal. ?Continue Ketoconazole 2% cream to feet QHS.  ? ?Knuckle pad of left hand ?Versus digital mucous cyst ?L 3rd finger ? Benign-appearing.  Observation.   ?Do not recommend treatment at this time as is small and asymptomatic.   ?Call clinic for new or changing lesions.  Recommend daily use of broad spectrum spf 30+ sunscreen to sun-exposed areas.  ? ?Skin cancer screening ? ?Lentigines ?- Scattered tan macules ?- Due to sun exposure ?- Benign-appearing, observe ?- Recommend daily broad spectrum sunscreen SPF 30+ to sun-exposed areas, reapply every 2 hours as needed. ?- Call for any changes ? ?Seborrheic  Keratoses ?- Stuck-on, waxy, tan-brown papules and/or plaques  ?- Benign-appearing ?- Discussed benign etiology and prognosis. ?- Observe ?- Call for any changes ? ?Melanocytic Nevi ?- Tan-brown and/or pink-flesh-colored symmetric macules and papules ?- Benign appearing on exam today ?- Observation ?- Call clinic for new or changing moles ?- Recommend daily use of broad spectrum spf 30+ sunscreen to sun-exposed areas.  ? ?Hemangiomas ?- Red papules ?- Discussed benign nature ?- Observe ?- Call for any changes ? ?Actinic Damage ?- Chronic condition, secondary to cumulative UV/sun exposure ?- diffuse scaly erythematous macules with underlying dyspigmentation ?- Recommend daily broad spectrum sunscreen SPF 30+ to sun-exposed areas, reapply every 2 hours as needed.  ?- Staying in the shade or wearing long sleeves, sun glasses (UVA+UVB protection) and wide brim hats (4-inch brim around the entire circumference of the hat) are also recommended for sun protection.  ?- Call for new or changing lesions. ? ?History of Dysplastic Nevus ?- No evidence of recurrence today ?- Recommend regular full body skin exams ?- Recommend daily broad spectrum sunscreen SPF 30+ to sun-exposed areas, reapply every 2 hours as needed.  ?- Call if any new or changing lesions are noted between office visits ? ?Acrochordons (Skin Tags) ?- Fleshy, skin-colored pedunculated papules ?- Benign appearing.  ?- Observe. ?- If desired, they can be removed with an in office procedure that is not covered by insurance. ?- Please call the clinic if you notice any new or changing lesions. ? ?Skin cancer screening performed today. ? ?Return in  about 1 year (around 01/30/2023) for TBSE. ? ?I, Rudell Cobb, CMA, am acting as scribe for Sarina Ser, MD . ?Documentation: I have reviewed the above documentation for accuracy and completeness, and I agree with the above. ? ?Sarina Ser, MD ? ? ?

## 2022-01-29 NOTE — Patient Instructions (Signed)

## 2022-02-01 ENCOUNTER — Encounter: Payer: Self-pay | Admitting: Dermatology

## 2022-02-23 ENCOUNTER — Encounter: Payer: Self-pay | Admitting: Gastroenterology

## 2022-03-02 ENCOUNTER — Ambulatory Visit (AMBULATORY_SURGERY_CENTER): Payer: BC Managed Care – PPO | Admitting: Gastroenterology

## 2022-03-02 ENCOUNTER — Encounter: Payer: Self-pay | Admitting: Gastroenterology

## 2022-03-02 VITALS — BP 124/73 | HR 57 | Temp 98.4°F | Resp 17 | Ht 72.0 in | Wt 269.0 lb

## 2022-03-02 DIAGNOSIS — Z1211 Encounter for screening for malignant neoplasm of colon: Secondary | ICD-10-CM | POA: Diagnosis not present

## 2022-03-02 MED ORDER — SODIUM CHLORIDE 0.9 % IV SOLN: 500.0000 mL | Freq: Once | INTRAVENOUS | Status: DC

## 2022-03-02 NOTE — Op Note (Signed)
[facility] ?Patient Name: Raymond West ?Procedure Date: 03/02/2022 10:46 AM ?MRN: [id number] ?Endoscopist: Estill Cotta. Loletha Carrow , MD ?Age: 46 ?Referring MD:  ?Date of Birth: [birth year] ?Gender: Male ?Account #: [id number] ?Procedure:                Colonoscopy ?Indications:              Screening for colorectal malignant neoplasm, This  ?                          is the patient's first colonoscopy ?Medicines:                Monitored Anesthesia Care ?Procedure:                Pre-Anesthesia Assessment: ?                          - Prior to the procedure, a History and Physical  ?                          was performed, and patient medications and  ?                          allergies were reviewed. The patient's tolerance of  ?                          previous anesthesia was also reviewed. The risks  ?                          and benefits of the procedure and the sedation  ?                          options and risks were discussed with the patient.  ?                          All questions were answered, and informed consent  ?                          was obtained. Prior Anticoagulants: The patient has  ?                          taken no previous anticoagulant or antiplatelet  ?                          agents. ASA Grade Assessment: II - A patient with  ?                          mild systemic disease. After reviewing the risks  ?                          and benefits, the patient was deemed in  ?                          satisfactory condition to undergo the procedure. ?  After obtaining informed consent, the colonoscope  ?                          was passed under direct vision. Throughout the  ?                          procedure, the patient's blood pressure, pulse, and  ?                          oxygen saturations were monitored continuously. The  ?                          Olympus CF-[id number] (#[id number]) Colonoscope was  ?                          introduced through the anus and  advanced to the the  ?                          cecum, identified by appendiceal orifice and  ?                          ileocecal valve. The colonoscopy was performed  ?                          without difficulty. The patient tolerated the  ?                          procedure well. The quality of the bowel  ?                          preparation was good. The ileocecal valve,  ?                          appendiceal orifice, and rectum were photographed. ?Scope In: 10:54:34 AM ?Scope Out: 11:09:21 AM ?Scope Withdrawal Time: 0 hours 9 minutes 24 seconds  ?Total Procedure Duration: 0 hours 14 minutes 47 seconds  ?Findings:                 The perianal and digital rectal examinations were  ?                          normal. ?                          Repeat examination of right colon under NBI  ?                          performed. ?                          A few diverticula were found in the left colon. ?                          The exam was otherwise without abnormality on  ?  direct and retroflexion views. ?Complications:            No immediate complications. ?Estimated Blood Loss:     Estimated blood loss: none. ?Impression:               - Diverticulosis in the left colon. ?                          - The examination was otherwise normal on direct  ?                          and retroflexion views. ?                          - No specimens collected. ?Recommendation:           - Patient has a contact number available for  ?                          emergencies. The signs and symptoms of potential  ?                          delayed complications were discussed with the  ?                          patient. Return to normal activities tomorrow.  ?                          Written discharge instructions were provided to the  ?                          patient. ?                          - Resume previous diet. ?                          - Continue present medications. ?                           - Repeat colonoscopy in 10 years for screening  ?                          purposes. ?Jeremiah Tarpley L. Loletha Carrow, MD ?03/02/2022 11:13:42 AM ?This report has been signed electronically. ?

## 2022-03-02 NOTE — Progress Notes (Signed)
Vs CW  Pt's states no medical or surgical changes since previsit or office visit.  

## 2022-03-02 NOTE — Progress Notes (Signed)
To Pacu, VSS. Report to [email or web address].tb 

## 2022-03-02 NOTE — Progress Notes (Signed)
History and Physical: ? This patient presents for endoscopic testing for: ?Encounter Diagnosis  ?Name Primary?  ? Special screening for malignant neoplasms, colon Yes  ? ? ?Seen in office 01/16/22 for bloating. ?This is a screening colonoscopy. ? ?ROS: ?Patient denies chest pain or shortness of breath ? ? ?Past Medical History: ?Past Medical History:  ?Diagnosis Date  ? Dysplastic nevus 04/02/2010  ? right upper back, 3.0 cm lat to spine, moderate to severe atypia   ? Neoplasm of uncertain behavior of skin   ? Unspecified essential hypertension   ? ? ? ?Past Surgical History: ?Past Surgical History:  ?Procedure Laterality Date  ? NO PAST SURGERIES    ? ? ?Allergies: ?Allergies  ?Allergen Reactions  ? Amoxicillin   ?  REACTION: rash  ? Benazepril Cough  ? Keflex [Cephalexin]   ?  Rash ?  ? Penicillins Rash  ? ? ?Outpatient Meds: ?Current Outpatient Medications  ?Medication Sig Dispense Refill  ? amLODipine (NORVASC) 5 MG tablet Take 1 tablet (5 mg total) by mouth daily. 90 tablet 3  ? losartan (COZAAR) 100 MG tablet Take 0.5 tablets (50 mg total) by mouth daily. 45 tablet 3  ? ketoconazole (NIZORAL) 2 % cream Apply to feet and in between toes nightly. (Patient not taking: Reported on 03/02/2022) 60 g 11  ? loratadine (CLARITIN) 10 MG tablet Take 1 tablet (10 mg total) by mouth daily as needed for allergies. 90 tablet 2  ? ?Current Facility-Administered Medications  ?Medication Dose Route Frequency Provider Last Rate Last Admin  ? 0.9 %  sodium chloride infusion  500 mL Intravenous Once Doran Stabler, MD      ? ? ? ? ?___________________________________________________________________ ?Objective  ? ?Exam: ? ?BP (!) 154/56   Pulse 68   Temp 98.4 ?F (36.9 ?C)   Ht 6' (1.829 m)   Wt 269 lb (122 kg)   SpO2 99%   BMI 36.48 kg/m?  ? ?CV: RRR without murmur, S1/S2 ?Resp: clear to auscultation bilaterally, normal RR and effort noted ?GI: soft, no tenderness, with active bowel sounds. ? ? ?Assessment: ?Encounter  Diagnosis  ?Name Primary?  ? Special screening for malignant neoplasms, colon Yes  ? ? ? ?Plan: ?Colonoscopy ? The benefits and risks of the planned procedure were described in detail with the patient or (when appropriate) their health care proxy.  Risks were outlined as including, but not limited to, bleeding, infection, perforation, adverse medication reaction leading to cardiac or pulmonary decompensation, pancreatitis (if ERCP).  The limitation of incomplete mucosal visualization was also discussed.  No guarantees or warranties were given. ? ? ? ?The patient is appropriate for an endoscopic procedure in the ambulatory setting. ? ? - Wilfrid Lund, MD ? ? ? ? ?

## 2022-03-02 NOTE — Patient Instructions (Signed)
YOU HAD AN ENDOSCOPIC PROCEDURE TODAY AT THE [facility]:   Refer to the procedure report that was given to you for any specific questions about what was found during the examination.  If the procedure report does not answer your questions, please call your gastroenterologist to clarify.  If you requested that your care partner not be given the details of your procedure findings, then the procedure report has been included in a sealed envelope for you to review at your convenience later.  YOU SHOULD EXPECT: Some feelings of bloating in the abdomen. Passage of more gas than usual.  Walking can help get rid of the air that was put into your GI tract during the procedure and reduce the bloating. If you had a lower endoscopy (such as a colonoscopy or flexible sigmoidoscopy) you may notice spotting of blood in your stool or on the toilet paper. If you underwent a bowel prep for your procedure, you may not have a normal bowel movement for a few days.  Please Note:  You might notice some irritation and congestion in your nose or some drainage.  This is from the oxygen used during your procedure.  There is no need for concern and it should clear up in a day or so.  SYMPTOMS TO REPORT IMMEDIATELY:   Following lower endoscopy (colonoscopy or flexible sigmoidoscopy):  Excessive amounts of blood in the stool  Significant tenderness or worsening of abdominal pains  Swelling of the abdomen that is new, acute  Fever of 100F or higher  For urgent or emergent issues, a gastroenterologist can be reached at any hour by calling [phone]. Do not use MyChart messaging for urgent concerns.    DIET:  We do recommend a small meal at first, but then you may proceed to your regular diet.  Drink plenty of fluids but you should avoid alcoholic beverages for 24 hours.  ACTIVITY:  You should plan to take it easy for the rest of today and you should NOT DRIVE or use heavy machinery until tomorrow (because  of the sedation medicines used during the test).    FOLLOW UP: Our staff will call the number listed on your records 48-72 hours following your procedure to check on you and address any questions or concerns that you may have regarding the information given to you following your procedure. If we do not reach you, we will leave a message.  We will attempt to reach you two times.  During this call, we will ask if you have developed any symptoms of COVID 19. If you develop any symptoms (ie: fever, flu-like symptoms, shortness of breath, cough etc.) before then, please call [phone].  If you test positive for Covid 19 in the 2 weeks post procedure, please call and report this information to us.    If any biopsies were taken you will be contacted by phone or by letter within the next 1-3 weeks.  Please call us at [phone] if you have not heard about the biopsies in 3 weeks.    SIGNATURES/CONFIDENTIALITY: You and/or your care partner have signed paperwork which will be entered into your electronic medical record.  These signatures attest to the fact that that the information above on your After Visit Summary has been reviewed and is understood.  Full responsibility of the confidentiality of this discharge information lies with you and/or your care-partner. 

## 2022-03-04 ENCOUNTER — Telehealth: Payer: Self-pay | Admitting: *Deleted

## 2022-03-04 NOTE — Telephone Encounter (Signed)
?  Follow up Call- ? ? ?  03/02/2022  ? 10:14 AM  ?Call back number  ?Post procedure Call Back phone  # [phone]  ?Permission to leave phone message Yes  ?  ? ?Patient questions: ? ?Do you have a fever, pain , or abdominal swelling? No. ?Pain Score  0 * ? ?Have you tolerated food without any problems? Yes.   ? ?Have you been able to return to your normal activities? Yes.   ? ?Do you have any questions about your discharge instructions: ?Diet   No. ?Medications  No. ?Follow up visit  No. ? ?Do you have questions or concerns about your Care? No. ? ?Actions: ?* If pain score is 4 or above: ?No action needed, pain <4. ? ? ?

## 2022-03-15 ENCOUNTER — Emergency Department
Admission: EM | Admit: 2022-03-15 | Discharge: 2022-03-15 | Disposition: A | Discharge status: Home / Self Care | Payer: BC Managed Care – PPO | Attending: Emergency Medicine | Admitting: Emergency Medicine

## 2022-03-15 ENCOUNTER — Emergency Department: Payer: BC Managed Care – PPO

## 2022-03-15 ENCOUNTER — Other Ambulatory Visit: Payer: Self-pay

## 2022-03-15 ENCOUNTER — Encounter: Payer: Self-pay | Admitting: Emergency Medicine

## 2022-03-15 DIAGNOSIS — I10 Essential (primary) hypertension: Secondary | ICD-10-CM | POA: Insufficient documentation

## 2022-03-15 DIAGNOSIS — R109 Unspecified abdominal pain: Secondary | ICD-10-CM | POA: Diagnosis not present

## 2022-03-15 DIAGNOSIS — N200 Calculus of kidney: Secondary | ICD-10-CM | POA: Diagnosis not present

## 2022-03-15 DIAGNOSIS — M5136 Other intervertebral disc degeneration, lumbar region: Secondary | ICD-10-CM | POA: Diagnosis not present

## 2022-03-15 DIAGNOSIS — R1031 Right lower quadrant pain: Secondary | ICD-10-CM | POA: Insufficient documentation

## 2022-03-15 DIAGNOSIS — M5134 Other intervertebral disc degeneration, thoracic region: Secondary | ICD-10-CM | POA: Diagnosis not present

## 2022-03-15 DIAGNOSIS — Z85828 Personal history of other malignant neoplasm of skin: Secondary | ICD-10-CM | POA: Diagnosis not present

## 2022-03-15 DIAGNOSIS — R3129 Other microscopic hematuria: Secondary | ICD-10-CM | POA: Diagnosis not present

## 2022-03-15 DIAGNOSIS — K760 Fatty (change of) liver, not elsewhere classified: Secondary | ICD-10-CM | POA: Diagnosis not present

## 2022-03-15 DIAGNOSIS — D72829 Elevated white blood cell count, unspecified: Secondary | ICD-10-CM | POA: Insufficient documentation

## 2022-03-15 DIAGNOSIS — I878 Other specified disorders of veins: Secondary | ICD-10-CM | POA: Diagnosis not present

## 2022-03-15 LAB — COMPREHENSIVE METABOLIC PANEL
ALT: 43 U/L (ref 0–44)
AST: 30 U/L (ref 15–41)
Albumin: 4.5 g/dL (ref 3.5–5.0)
Alkaline Phosphatase: 56 U/L (ref 38–126)
Anion gap: 8 (ref 5–15)
BUN: 22 mg/dL — ABNORMAL HIGH (ref 6–20)
CO2: 25 mmol/L (ref 22–32)
Calcium: 9.2 mg/dL (ref 8.9–10.3)
Chloride: 105 mmol/L (ref 98–111)
Creatinine, Ser: 0.85 mg/dL (ref 0.61–1.24)
GFR, Estimated: >60 mL/min (ref >60)
Glucose, Bld: 125 mg/dL — ABNORMAL HIGH (ref 70–99)
Potassium: 3.8 mmol/L (ref 3.5–5.1)
Sodium: 138 mmol/L (ref 135–145)
Total Bilirubin: 0.9 mg/dL (ref 0.3–1.2)
Total Protein: 7.7 g/dL (ref 6.5–8.1)

## 2022-03-15 LAB — URINALYSIS, ROUTINE W REFLEX MICROSCOPIC
Bacteria, UA: NONE SEEN
Bilirubin Urine: NEGATIVE
Glucose, UA: NEGATIVE mg/dL
Ketones, ur: NEGATIVE mg/dL
Leukocytes,Ua: NEGATIVE
Nitrite: NEGATIVE
Protein, ur: NEGATIVE mg/dL
RBC / HPF: >50 RBC/hpf — ABNORMAL HIGH (ref 0–5)
Specific Gravity, Urine: 1.031 — ABNORMAL HIGH (ref 1.005–1.030)
Squamous Epithelial / HPF: NONE SEEN (ref 0–5)
pH: 5 (ref 5.0–8.0)

## 2022-03-15 LAB — CBC
HCT: 44.2 % (ref 39.0–52.0)
Hemoglobin: 14.5 g/dL (ref 13.0–17.0)
MCH: 28.5 pg (ref 26.0–34.0)
MCHC: 32.8 g/dL (ref 30.0–36.0)
MCV: 86.8 fL (ref 80.0–100.0)
Platelets: 250 10*3/uL (ref 150–400)
RBC: 5.09 MIL/uL (ref 4.22–5.81)
RDW: 12.8 % (ref 11.5–15.5)
WBC: 12.4 10*3/uL — ABNORMAL HIGH (ref 4.0–10.5)
nRBC: 0 % (ref 0.0–0.2)

## 2022-03-15 LAB — LIPASE, BLOOD: Lipase: 34 U/L (ref 11–51)

## 2022-03-15 MED ORDER — KETOROLAC TROMETHAMINE 15 MG/ML IJ SOLN
15.0000 mg | Freq: Once | INTRAMUSCULAR | Status: AC
  Administered 2022-03-15: 15 mg via INTRAVENOUS
  Filled 2022-03-15: qty 1

## 2022-03-15 MED ORDER — ONDANSETRON HCL 4 MG/2ML IJ SOLN
4.0000 mg | Freq: Once | INTRAMUSCULAR | Status: AC
  Administered 2022-03-15: 4 mg via INTRAVENOUS
  Filled 2022-03-15: qty 2

## 2022-03-15 NOTE — ED Provider Notes (Signed)
? ?[facility] ?Provider Note ? ? ? Event Date/Time  ? First MD Initiated Contact with Patient 03/15/22 [phone]   ?  (approximate) ? ? ?History  ? ?Abdominal Pain ? ? ?HPI ? ?Raymond West is a 46 y.o. male with a history of hypertension who presents for evaluation of abdominal pain.  Patient reports sudden onset around lunchtime of severe sharp right lower quadrant abdominal pain radiating to the right groin.  Nausea but no vomiting.  No dysuria or hematuria, no chest pain or shortness of breath, no diarrhea or constipation.  No prior episodes of similar pain.  No prior abdominal surgeries.  No prior history of gallstones or kidney stones.  Patient is complaining of 5/10 pain at this time. ?  ? ? ?Past Medical History:  ?Diagnosis Date  ? Dysplastic nevus 04/02/2010  ? right upper back, 3.0 cm lat to spine, moderate to severe atypia   ? Neoplasm of uncertain behavior of skin   ? Unspecified essential hypertension   ? ? ?Past Surgical History:  ?Procedure Laterality Date  ? NO PAST SURGERIES    ? ? ? ?Physical Exam  ? ?Triage Vital Signs: ?ED Triage Vitals  ?Enc Vitals Group  ?   BP 03/15/22 0137 (!) 148/95  ?   Pulse Rate 03/15/22 0137 67  ?   Resp 03/15/22 0137 18  ?   Temp 03/15/22 0137 98.6 ?F (37 ?C)  ?   Temp Source 03/15/22 0137 Oral  ?   SpO2 03/15/22 0137 98 %  ?   Weight 03/15/22 0136 260 lb (117.9 kg)  ?   Height 03/15/22 0136 6' (1.829 m)  ?   Head Circumference --   ?   Peak Flow --   ?   Pain Score 03/15/22 0136 6  ?   Pain Loc --   ?   Pain Edu? --   ?   Excl. in [place]? --   ? ? ?Most recent vital signs: ?Vitals:  ? 03/15/22 0349 03/15/22 0530  ?BP:  129/81  ?Pulse: (!) 56 (!) 53  ?Resp:  18  ?Temp:    ?SpO2:  94%  ? ? ? ?Constitutional: Alert and oriented. Well appearing and in no apparent distress. ?HEENT: ?     Head: Normocephalic and atraumatic.    ?     Eyes: Conjunctivae are normal. Sclera is non-icteric.  ?     Mouth/Throat: Mucous membranes are moist.  ?     Neck: Supple with no  signs of meningismus. ?Cardiovascular: Regular rate and rhythm. No murmurs, gallops, or rubs. 2+ symmetrical distal pulses are present in all extremities.  ?Respiratory: Normal respiratory effort. Lungs are clear to auscultation bilaterally.  ?Gastrointestinal: Soft, ttp over the RUQ, and non distended with positive bowel sounds. No rebound or guarding. ?Genitourinary: No CVA tenderness. Bilateral testicles are descended with no tenderness to palpation, bilateral positive cremasteric reflexes are present, no swelling or erythema of the scrotum. No evidence of inguinal hernia. ?Musculoskeletal:  No edema, cyanosis, or erythema of extremities. ?Neurologic: Normal speech and language. Face is symmetric. Moving all extremities. No gross focal neurologic deficits are appreciated. ?Skin: Skin is warm, dry and intact. No rash noted. ?Psychiatric: Mood and affect are normal. Speech and behavior are normal. ? ?ED Results / Procedures / Treatments  ? ?Labs ?(all labs ordered are listed, but only abnormal results are displayed) ?Labs Reviewed  ?COMPREHENSIVE METABOLIC PANEL - Abnormal; Notable for the following components:  ?  Result Value  ? Glucose, Bld 125 (*)   ? BUN 22 (*)   ? All other components within normal limits  ?CBC - Abnormal; Notable for the following components:  ? WBC 12.4 (*)   ? All other components within normal limits  ?URINALYSIS, ROUTINE W REFLEX MICROSCOPIC - Abnormal; Notable for the following components:  ? Color, Urine YELLOW (*)   ? APPearance CLEAR (*)   ? Specific Gravity, Urine 1.031 (*)   ? Hgb urine dipstick LARGE (*)   ? RBC / HPF >50 (*)   ? All other components within normal limits  ?LIPASE, BLOOD  ? ? ? ?EKG ? ?ED ECG REPORT ?I, Rudene Re, the attending physician, personally viewed and interpreted this ECG. ? ?Sinus rhythm with a rate of 57, normal intervals, normal axis, no ST elevations or depressions ? ?RADIOLOGY ?I, Rudene Re, attending MD, have personally viewed and  interpreted the images obtained during this visit as below: ? ?CT renal negative ? ?RUQ [place] negative ? ? ?___________________________________________________ ?Interpretation by Radiologist:  ?CT Renal Stone Study ? ?Result Date: 03/15/2022 ?CLINICAL DATA:  46 year old male with right lower quadrant and flank pain radiating to the right groin. EXAM: CT ABDOMEN AND PELVIS WITHOUT CONTRAST TECHNIQUE: Multidetector CT imaging of the abdomen and pelvis was performed following the standard protocol without IV contrast. RADIATION DOSE REDUCTION: This exam was performed according to the departmental dose-optimization program which includes automated exposure control, adjustment of the mA and/or kV according to patient size and/or use of iterative reconstruction technique. COMPARISON:  None Available. FINDINGS: Lower chest: Small oval subpleural 6 mm left lower lobe lung nodule series 4, image 1. There are 2 similar 5 mm or smaller right middle and lower lobe lung nodules also visible (the larger also on image 1. No pleural or pericardial effusion. No cardiomegaly. Hepatobiliary: Questionable hepatic steatosis. Otherwise negative noncontrast liver and gallbladder. Pancreas: Negative. Spleen: Negative. Adrenals/Urinary Tract: Normal adrenal glands. Left nephrolithiasis with multiple small calculi up to 5 mm. No right intrarenal calculus. No hydronephrosis or hydroureter. Both ureters are decompressed and normal to the bladder. There are several left hemipelvis phleboliths. No convincing pararenal inflammation. Urinary bladder is diminutive and unremarkable. Stomach/Bowel: Somewhat redundant large bowel in the cecum is on a lax mesentery located in the right mid abdomen. Normal retrocecal appendix on series 2, image 54. No large bowel inflammation. Decompressed terminal ileum and no dilated small bowel. Negative stomach and duodenum. No free air or free fluid. Vascular/Lymphatic: Normal caliber abdominal aorta. No calcified  atherosclerosis or lymphadenopathy identified. Reproductive: Negative. Other: No pelvic free fluid. Musculoskeletal: Chronic disc and endplate degeneration in the lower thoracic and lower lumbar spine. No acute osseous abnormality identified. IMPRESSION: 1. Left nephrolithiasis.  But no obstructive uropathy. 2. Normal appendix. And no acute or inflammatory process identified in the non-contrast abdomen or pelvis. 3. Multiple small pulmonary nodules at the lung bases measuring up to 6 mm. Recommend a non-contrast Chest CT at 6-12 months. If patient is high risk for malignancy, recommend an additional non-contrast Chest CT at 18-24 months; if patient is low risk for malignancy a non-contrast Chest CT at 18-24 months is optional. Reference: Radiology. 2017; 284(1):228-43. Electronically Signed   By: Genevie Ann M.D.   On: 03/15/2022 04:16  ? ?US ABDOMEN LIMITED RUQ (LIVER/GB) ? ?Result Date: 03/15/2022 ?CLINICAL DATA:  46 year old male with right abdominal pain radiating to the groin. Unrevealing noncontrast CT Abdomen and Pelvis. EXAM: ULTRASOUND ABDOMEN LIMITED RIGHT UPPER QUADRANT COMPARISON:  CT Abdomen and Pelvis 0405 hours today. FINDINGS: Gallbladder: No gallstones or wall thickening visualized. No sonographic Murphy sign noted by sonographer. Common bile duct: Diameter: 4 mm, normal. Liver: Echogenic liver (image 44). No discrete liver lesion. Portal vein is patent on color Doppler imaging with normal direction of blood flow towards the liver. Other: Negative visible right kidney.  No free fluid. IMPRESSION: Fatty liver.  But otherwise normal right upper quadrant ultrasound. Electronically Signed   By: Genevie Ann M.D.   On: 03/15/2022 05:44   ? ? ? ? ?PROCEDURES: ? ?Critical Care performed: No ? ?Procedures ? ? ? ?IMPRESSION / MDM / ASSESSMENT AND PLAN / ED COURSE  ?I reviewed the triage vital signs and the nursing notes. ? ?46 y.o. male with a history of hypertension who presents for evaluation of abdominal pain.   Patient with sudden onset of right lower quadrant sharp abdominal pain radiating to the groin associated with nausea since lunchtime.  Currently pain is 5 out of 10.  On exam is well-appearing in no distress wi

## 2022-03-15 NOTE — ED Triage Notes (Signed)
Pt to ED via POV with c/o RLQ abd pain that radiates into his R groin. Pt states sudden onset at approx lunch time. Pt states nausea at this time. Pt states no difficulty urinating.  ?

## 2022-03-17 ENCOUNTER — Encounter: Payer: Self-pay | Admitting: Family Medicine

## 2022-03-17 ENCOUNTER — Ambulatory Visit (INDEPENDENT_AMBULATORY_CARE_PROVIDER_SITE_OTHER): Payer: BC Managed Care – PPO | Admitting: Family Medicine

## 2022-03-17 VITALS — BP 128/76 | HR 75 | Temp 97.7°F | Ht 70.0 in | Wt 264.4 lb

## 2022-03-17 DIAGNOSIS — R918 Other nonspecific abnormal finding of lung field: Secondary | ICD-10-CM

## 2022-03-17 DIAGNOSIS — R3129 Other microscopic hematuria: Secondary | ICD-10-CM | POA: Insufficient documentation

## 2022-03-17 DIAGNOSIS — K760 Fatty (change of) liver, not elsewhere classified: Secondary | ICD-10-CM | POA: Diagnosis not present

## 2022-03-17 DIAGNOSIS — N50811 Right testicular pain: Secondary | ICD-10-CM | POA: Insufficient documentation

## 2022-03-17 DIAGNOSIS — D72829 Elevated white blood cell count, unspecified: Secondary | ICD-10-CM

## 2022-03-17 LAB — CBC WITH DIFFERENTIAL/PLATELET
Basophils Absolute: 0.1 10*3/uL (ref 0.0–0.1)
Basophils Relative: 0.8 % (ref 0.0–3.0)
Eosinophils Absolute: 0.2 10*3/uL (ref 0.0–0.7)
Eosinophils Relative: 1.9 % (ref 0.0–5.0)
HCT: 43.8 % (ref 39.0–52.0)
Hemoglobin: 15 g/dL (ref 13.0–17.0)
Lymphocytes Relative: 33.9 % (ref 12.0–46.0)
Lymphs Abs: 3 10*3/uL (ref 0.7–4.0)
MCHC: 34.2 g/dL (ref 30.0–36.0)
MCV: 87.4 fl (ref 78.0–100.0)
Monocytes Absolute: 0.7 10*3/uL (ref 0.1–1.0)
Monocytes Relative: 7.4 % (ref 3.0–12.0)
Neutro Abs: 5 10*3/uL (ref 1.4–7.7)
Neutrophils Relative %: 56 % (ref 43.0–77.0)
Platelets: 211 10*3/uL (ref 150.0–400.0)
RBC: 5.01 Mil/uL (ref 4.22–5.81)
RDW: 13.3 % (ref 11.5–15.5)
WBC: 8.9 10*3/uL (ref 4.0–10.5)

## 2022-03-17 LAB — POC URINALSYSI DIPSTICK (AUTOMATED)
Bilirubin, UA: 1
Blood, UA: NEGATIVE
Glucose, UA: NEGATIVE
Ketones, UA: NEGATIVE
Leukocytes, UA: NEGATIVE
Nitrite, UA: NEGATIVE
Protein, UA: NEGATIVE
Spec Grav, UA: >=1.03 — AB (ref 1.010–1.025)
Urobilinogen, UA: 0.2 E.U./dL
pH, UA: 6 (ref 5.0–8.0)

## 2022-03-17 NOTE — Assessment & Plan Note (Signed)
In ER in setting of R back,flank, abd and then testicular pain  ?Suspect pt passed a R sided stone befor the CT was done ?fam h/o renal stones (mother/brother)  ?Reviewed hospital records, lab results and studies in detail  ? ?Re check ua today  ?Consider ref to urology (has stones in L kidney,not obstructing) ?

## 2022-03-17 NOTE — Assessment & Plan Note (Signed)
Incidental nodules both bases seen on CT abd  ?Largest 6 mm ?Low risk for malignancy ?Recommends re check 18-24 months  ? ?No resp symptoms ?No smoking history ?

## 2022-03-17 NOTE — Progress Notes (Signed)
? ?Subjective:  ? ? Patient ID: Raymond West, male    DOB: 02-21-1976, 46 y.o.   MRN: [id number] ? ?HPI ?Pt presents for f/u of ER visit for abd pain on 03/15/22 ? ?Wt Readings from Last 3 Encounters:  ?03/17/22 264 lb 6 oz (119.9 kg)  ?03/15/22 260 lb (117.9 kg)  ?03/02/22 269 lb (122 kg)  ? ?37.93 kg/m? ? ?He presented with sharp/sudden RLQ abd pain that rad to R groin ?Had mowed that day , then suddenly had R abd and testicle pain  ?Got really severe  ?Had nausea but no other assoc symptoms  ?Bp was elevated at the time of the visit due to pain  ? ?It eased off after midnight in ER  ? ? ?He did have a colonoscopy on 4/24-uneventful   ?Few diverticula but otherwise normal ? ?Labs ?Results for orders placed or performed during the hospital encounter of 03/15/22  ?Lipase, blood  ?Result Value Ref Range  ? Lipase 34 11 - 51 U/L  ?Comprehensive metabolic panel  ?Result Value Ref Range  ? Sodium 138 135 - 145 mmol/L  ? Potassium 3.8 3.5 - 5.1 mmol/L  ? Chloride 105 98 - 111 mmol/L  ? CO2 25 22 - 32 mmol/L  ? Glucose, Bld 125 (H) 70 - 99 mg/dL  ? BUN 22 (H) 6 - 20 mg/dL  ? Creatinine, Ser 0.85 0.61 - 1.24 mg/dL  ? Calcium 9.2 8.9 - 10.3 mg/dL  ? Total Protein 7.7 6.5 - 8.1 g/dL  ? Albumin 4.5 3.5 - 5.0 g/dL  ? AST 30 15 - 41 U/L  ? ALT 43 0 - 44 U/L  ? Alkaline Phosphatase 56 38 - 126 U/L  ? Total Bilirubin 0.9 0.3 - 1.2 mg/dL  ? GFR, Estimated >60 >60 mL/min  ? Anion gap 8 5 - 15  ?CBC  ?Result Value Ref Range  ? WBC 12.4 (H) 4.0 - 10.5 K/uL  ? RBC 5.09 4.22 - 5.81 MIL/uL  ? Hemoglobin 14.5 13.0 - 17.0 g/dL  ? HCT 44.2 39.0 - 52.0 %  ? MCV 86.8 80.0 - 100.0 fL  ? MCH 28.5 26.0 - 34.0 pg  ? MCHC 32.8 30.0 - 36.0 g/dL  ? RDW 12.8 11.5 - 15.5 %  ? Platelets 250 150 - 400 K/uL  ? nRBC 0.0 0.0 - 0.2 %  ?Urinalysis, Routine w reflex microscopic  ?Result Value Ref Range  ? Color, Urine YELLOW (A) YELLOW  ? APPearance CLEAR (A) CLEAR  ? Specific Gravity, Urine 1.031 (H) 1.005 - 1.030  ? pH 5.0 5.0 - 8.0  ? Glucose, UA NEGATIVE  NEGATIVE mg/dL  ? Hgb urine dipstick LARGE (A) NEGATIVE  ? Bilirubin Urine NEGATIVE NEGATIVE  ? Ketones, ur NEGATIVE NEGATIVE mg/dL  ? Protein, ur NEGATIVE NEGATIVE mg/dL  ? Nitrite NEGATIVE NEGATIVE  ? Leukocytes,Ua NEGATIVE NEGATIVE  ? RBC / HPF >50 (H) 0 - 5 RBC/hpf  ? WBC, UA 0-5 0 - 5 WBC/hpf  ? Bacteria, UA NONE SEEN NONE SEEN  ? Squamous Epithelial / LPF NONE SEEN 0 - 5  ? Mucus PRESENT   ?  ?Nl EKG  ?RUQ [place] neg  ?CT renal neg  ? ?No dysuria  ?No frequency  ? ? ?CT Renal Stone Study ? ?Result Date: 03/15/2022 ?CLINICAL DATA:  46 year old male with right lower quadrant and flank pain radiating to the right groin. EXAM: CT ABDOMEN AND PELVIS WITHOUT CONTRAST TECHNIQUE: Multidetector CT imaging of the abdomen and pelvis was  performed following the standard protocol without IV contrast. RADIATION DOSE REDUCTION: This exam was performed according to the departmental dose-optimization program which includes automated exposure control, adjustment of the mA and/or kV according to patient size and/or use of iterative reconstruction technique. COMPARISON:  None Available. FINDINGS: Lower chest: Small oval subpleural 6 mm left lower lobe lung nodule series 4, image 1. There are 2 similar 5 mm or smaller right middle and lower lobe lung nodules also visible (the larger also on image 1. No pleural or pericardial effusion. No cardiomegaly. Hepatobiliary: Questionable hepatic steatosis. Otherwise negative noncontrast liver and gallbladder. Pancreas: Negative. Spleen: Negative. Adrenals/Urinary Tract: Normal adrenal glands. Left nephrolithiasis with multiple small calculi up to 5 mm. No right intrarenal calculus. No hydronephrosis or hydroureter. Both ureters are decompressed and normal to the bladder. There are several left hemipelvis phleboliths. No convincing pararenal inflammation. Urinary bladder is diminutive and unremarkable. Stomach/Bowel: Somewhat redundant large bowel in the cecum is on a lax mesentery located in the  right mid abdomen. Normal retrocecal appendix on series 2, image 54. No large bowel inflammation. Decompressed terminal ileum and no dilated small bowel. Negative stomach and duodenum. No free air or free fluid. Vascular/Lymphatic: Normal caliber abdominal aorta. No calcified atherosclerosis or lymphadenopathy identified. Reproductive: Negative. Other: No pelvic free fluid. Musculoskeletal: Chronic disc and endplate degeneration in the lower thoracic and lower lumbar spine. No acute osseous abnormality identified. IMPRESSION: 1. Left nephrolithiasis.  But no obstructive uropathy. 2. Normal appendix. And no acute or inflammatory process identified in the non-contrast abdomen or pelvis. 3. Multiple small pulmonary nodules at the lung bases measuring up to 6 mm. Recommend a non-contrast Chest CT at 6-12 months. If patient is high risk for malignancy, recommend an additional non-contrast Chest CT at 18-24 months; if patient is low risk for malignancy a non-contrast Chest CT at 18-24 months is optional. Reference: Radiology. 2017; 284(1):228-43. Electronically Signed   By: Genevie Ann M.D.   On: 03/15/2022 04:16  ? ?US ABDOMEN LIMITED RUQ (LIVER/GB) ? ?Result Date: 03/15/2022 ?CLINICAL DATA:  46 year old male with right abdominal pain radiating to the groin. Unrevealing noncontrast CT Abdomen and Pelvis. EXAM: ULTRASOUND ABDOMEN LIMITED RIGHT UPPER QUADRANT COMPARISON:  CT Abdomen and Pelvis 0405 hours today. FINDINGS: Gallbladder: No gallstones or wall thickening visualized. No sonographic Murphy sign noted by sonographer. Common bile duct: Diameter: 4 mm, normal. Liver: Echogenic liver (image 44). No discrete liver lesion. Portal vein is patent on color Doppler imaging with normal direction of blood flow towards the liver. Other: Negative visible right kidney.  No free fluid. IMPRESSION: Fatty liver.  But otherwise normal right upper quadrant ultrasound. Electronically Signed   By: Genevie Ann M.D.   On: 03/15/2022 05:44    ? ? ? ?Noted multiple small pulm nodules  ?F/u rec 18-24 mo since he is low risk ? ?Fatty liver noted (lipase and lfts nl)  ? ?Blood in urine-worrisome for recent /passed kidney stone  ? ?Was tx with IV toradol and zofran  ? ?Bp is better now  ?BP Readings from Last 3 Encounters:  ?03/17/22 128/76  ?03/15/22 129/81  ?03/02/22 124/73  ? ?Pulse Readings from Last 3 Encounters:  ?03/17/22 75  ?03/15/22 (!) 53  ?03/02/22 (!) 57  ? ? ?Has had pain in the R groin in the past  ?None today at all   ? ?Now just a little dull pain in R side of back  ? ?He drinks lots of fluids  ?Does not drink alcohol  ? ?  Patient Active Problem List  ? Diagnosis Date Noted  ? Fatty liver 03/17/2022  ? Lung nodules 03/17/2022  ? Microscopic hematuria 03/17/2022  ? Right testicular pain 03/17/2022  ? Colon cancer screening 09/22/2021  ? Leukocytosis 09/25/2020  ? H/O: pneumonia 03/18/2018  ? Class 2 obesity due to excess calories with body mass index (BMI) of 37.0 to 37.9 in adult 06/26/2016  ? Routine general medical examination at a health care facility 05/27/2012  ? Essential hypertension 01/15/2010  ? ?Past Medical History:  ?Diagnosis Date  ? Dysplastic nevus 04/02/2010  ? right upper back, 3.0 cm lat to spine, moderate to severe atypia   ? Neoplasm of uncertain behavior of skin   ? Unspecified essential hypertension   ? ?Past Surgical History:  ?Procedure Laterality Date  ? NO PAST SURGERIES    ? ?Social History  ? ?Tobacco Use  ? Smoking status: Never  ? Smokeless tobacco: Never  ?Vaping Use  ? Vaping Use: Never used  ?Substance Use Topics  ? Alcohol use: Yes  ?  Alcohol/week: 0.0 standard drinks  ?  Comment: rarely  ? Drug use: No  ? ?Family History  ?Problem Relation Age of Onset  ? Leukemia Mother   ? Diabetes Mother   ? Kidney disease Father   ?     has been on dialysis for years  ? Diabetes Father   ? Hypertension Father   ? Neuropathy Father   ? Gout Father   ? Arthritis Maternal Grandmother   ? Arthritis Maternal Grandfather   ?  Arthritis Paternal Grandmother   ? Arthritis Paternal Grandfather   ? Heart attack Paternal Grandfather   ?     smoker  ? Colon cancer Neg Hx   ? Esophageal cancer Neg Hx   ? ?Allergies  ?Allergen Reactions

## 2022-03-17 NOTE — Patient Instructions (Addendum)
Try and drink lots of water  ?At least 64 oz of fluids daily  ?Lemon water is the is the best  ? ?Work on weight loss for the fatty liver  ?Avoid excessive alcohol  ? ?We will need to do another CT of your chest in 18-24 months to watch nodules (common and benign)  ? ?Let's re check your urine today for blood ?Your cbc  ?I may want to send you to a urologist  ?You have stones in your left kidney  ? ?You may have passed a kidney stone (it was done by the time they did the CT so they did not see it)  ?Glad you are feeling better  ? ? ? ? ?

## 2022-03-17 NOTE — Assessment & Plan Note (Signed)
Incidental finding on CT ?Nl LFTs ?No alcohol ?Obese ?No abd pain  ? ?Enc wt loss  ?Will monitor LFTs  ? ?

## 2022-03-17 NOTE — Assessment & Plan Note (Signed)
This was likely due to pain in hospital setting  ? ?Re check today ?This has been reactive in the past  ?

## 2022-03-17 NOTE — Assessment & Plan Note (Signed)
Pt presented to ER for R flank/abd and testicular pain  ?Much better now  ?Suspect he had passed a renal stone before his CT ?Reviewed hospital records, lab results and studies in detail  ? ?Now resolved  ?Rev CT, [place] and also recent colonoscopy  ?Reassuring exam  ?ua re check today (was pos for blood in ER)  ?Cbc re check  ?Consider urology opinion  ?

## 2022-09-23 ENCOUNTER — Encounter: Payer: Self-pay | Admitting: Family Medicine

## 2022-09-23 ENCOUNTER — Ambulatory Visit (INDEPENDENT_AMBULATORY_CARE_PROVIDER_SITE_OTHER): Payer: BC Managed Care – PPO | Admitting: Family Medicine

## 2022-09-23 VITALS — BP 136/80 | HR 60 | Temp 97.7°F | Ht 71.65 in | Wt 269.8 lb

## 2022-09-23 DIAGNOSIS — K760 Fatty (change of) liver, not elsewhere classified: Secondary | ICD-10-CM | POA: Diagnosis not present

## 2022-09-23 DIAGNOSIS — R918 Other nonspecific abnormal finding of lung field: Secondary | ICD-10-CM | POA: Diagnosis not present

## 2022-09-23 DIAGNOSIS — I10 Essential (primary) hypertension: Secondary | ICD-10-CM

## 2022-09-23 DIAGNOSIS — Z0000 Encounter for general adult medical examination without abnormal findings: Secondary | ICD-10-CM | POA: Diagnosis not present

## 2022-09-23 DIAGNOSIS — Z23 Encounter for immunization: Secondary | ICD-10-CM

## 2022-09-23 LAB — CBC WITH DIFFERENTIAL/PLATELET
Basophils Absolute: 0.1 10*3/uL (ref 0.0–0.1)
Basophils Relative: 0.8 % (ref 0.0–3.0)
Eosinophils Absolute: 0.2 10*3/uL (ref 0.0–0.7)
Eosinophils Relative: 2.7 % (ref 0.0–5.0)
HCT: 44.5 % (ref 39.0–52.0)
Hemoglobin: 15.1 g/dL (ref 13.0–17.0)
Lymphocytes Relative: 31.7 % (ref 12.0–46.0)
Lymphs Abs: 2.7 10*3/uL (ref 0.7–4.0)
MCHC: 33.9 g/dL (ref 30.0–36.0)
MCV: 87.4 fl (ref 78.0–100.0)
Monocytes Absolute: 0.5 10*3/uL (ref 0.1–1.0)
Monocytes Relative: 6.3 % (ref 3.0–12.0)
Neutro Abs: 4.9 10*3/uL (ref 1.4–7.7)
Neutrophils Relative %: 58.5 % (ref 43.0–77.0)
Platelets: 219 10*3/uL (ref 150.0–400.0)
RBC: 5.09 Mil/uL (ref 4.22–5.81)
RDW: 13.2 % (ref 11.5–15.5)
WBC: 8.4 10*3/uL (ref 4.0–10.5)

## 2022-09-23 LAB — TSH: TSH: 2.32 u[IU]/mL (ref 0.35–5.50)

## 2022-09-23 LAB — COMPREHENSIVE METABOLIC PANEL
ALT: 42 U/L (ref 0–53)
AST: 32 U/L (ref 0–37)
Albumin: 4.4 g/dL (ref 3.5–5.2)
Alkaline Phosphatase: 62 U/L (ref 39–117)
BUN: 15 mg/dL (ref 6–23)
CO2: 29 mEq/L (ref 19–32)
Calcium: 9 mg/dL (ref 8.4–10.5)
Chloride: 104 mEq/L (ref 96–112)
Creatinine, Ser: 0.83 mg/dL (ref 0.40–1.50)
GFR: 104.79 mL/min (ref >60.00)
Glucose, Bld: 97 mg/dL (ref 70–99)
Potassium: 4.4 mEq/L (ref 3.5–5.1)
Sodium: 139 mEq/L (ref 135–145)
Total Bilirubin: 0.7 mg/dL (ref 0.2–1.2)
Total Protein: 6.7 g/dL (ref 6.0–8.3)

## 2022-09-23 LAB — LIPID PANEL
Cholesterol: 183 mg/dL (ref 0–200)
HDL: 39 mg/dL — ABNORMAL LOW (ref >39.00)
LDL Cholesterol: 128 mg/dL — ABNORMAL HIGH (ref 0–99)
NonHDL: 144.32
Total CHOL/HDL Ratio: 5
Triglycerides: 80 mg/dL (ref 0.0–149.0)
VLDL: 16 mg/dL (ref 0.0–40.0)

## 2022-09-23 MED ORDER — LOSARTAN POTASSIUM 100 MG PO TABS: 50.0000 mg | ORAL_TABLET | Freq: Every day | ORAL | 3 refills | Status: DC

## 2022-09-23 MED ORDER — AMLODIPINE BESYLATE 5 MG PO TABS: 5.0000 mg | ORAL_TABLET | Freq: Every day | ORAL | 3 refills | Status: DC

## 2022-09-23 NOTE — Progress Notes (Signed)
Subjective:    Patient ID: Raymond West, male    DOB: 1976/06/15, 46 y.o.   MRN: [id number]  HPI Here for health maintenance exam and to review chronic medical problems    Wt Readings from Last 3 Encounters:  09/23/22 269 lb 12.8 oz (122.4 kg)  03/17/22 264 lb 6 oz (119.9 kg)  03/15/22 260 lb (117.9 kg)   36.95 kg/m  Doing well  Working   Diet : trying to eat better  Trying to stay away from junk   Exercise- very physical job    Immunization History  Administered Date(s) Administered   Influenza,inj,Quad PF,6+ Mos 12/01/2013, 08/27/2017, 08/04/2018, 09/19/2019, 09/20/2020, 09/22/2021   PFIZER(Purple Top)SARS-COV-2 Vaccination 02/23/2020, 03/15/2020   Pneumococcal Polysaccharide-23 08/26/2018   Td 08/04/2010   Tdap 11/02/2019   Health Maintenance Due  Topic Date Due   Hepatitis C Screening  Never done   COVID-19 Vaccine (3 - Pfizer risk series) 04/12/2020   INFLUENZA VACCINE  06/09/2022    Flu shot given today  Colonoscopy 02/2022  is up to date  10 year recall   No prostate complaints   Dermatology care : went earlier this year/ good report   HTN bp is stable today  No cp or palpitations or headaches or edema  No side effects to medicines  BP Readings from Last 3 Encounters:  09/23/22 136/80  03/17/22 128/76  03/15/22 129/81    Pulse Readings from Last 3 Encounters:  09/23/22 60  03/17/22 75  03/15/22 (!) 53    Losartan 50 mg daily  Amlodipine 5 mg daily   Lab Results  Component Value Date   CREATININE 0.85 03/15/2022   BUN 22 (H) 03/15/2022   NA 138 03/15/2022   K 3.8 03/15/2022   CL 105 03/15/2022   CO2 25 03/15/2022    H/o incidental lung nodules in 03/2022 with rec re check -12 months    H/o fatty liver Lab Results  Component Value Date   ALT 43 03/15/2022   AST 30 03/15/2022   ALKPHOS 56 03/15/2022   BILITOT 0.9 03/15/2022     Lab Results  Component Value Date   WBC 8.9 03/17/2022   HGB 15.0 03/17/2022   HCT 43.8  03/17/2022   MCV 87.4 03/17/2022   PLT 211.0 03/17/2022   Cholesterol Lab Results  Component Value Date   CHOL 173 09/22/2021   HDL 36.90 (L) 09/22/2021   LDLCALC 99 09/22/2021   LDLDIRECT 118.5 05/29/2013   TRIG 185.0 (H) 09/22/2021   CHOLHDL 5 09/22/2021   Does eat a fair amt of high fat foods Some beef and fried foods Prefers grilled chicken  No fast food   To much [place] foods   Patient Active Problem List   Diagnosis Date Noted   Fatty liver 03/17/2022   Lung nodules 03/17/2022   Microscopic hematuria 03/17/2022   Colon cancer screening 09/22/2021   H/O: pneumonia 03/18/2018   Class 2 obesity due to excess calories with body mass index (BMI) of 37.0 to 37.9 in adult 06/26/2016   Routine general medical examination at a health care facility 05/27/2012   Essential hypertension 01/15/2010   Past Medical History:  Diagnosis Date   Dysplastic nevus 04/02/2010   right upper back, 3.0 cm lat to spine, moderate to severe atypia    Neoplasm of uncertain behavior of skin    Unspecified essential hypertension    Past Surgical History:  Procedure Laterality Date   NO PAST SURGERIES  Social History   Tobacco Use   Smoking status: Never   Smokeless tobacco: Never  Vaping Use   Vaping Use: Never used  Substance Use Topics   Alcohol use: Yes    Alcohol/week: 0.0 standard drinks of alcohol    Comment: rarely   Drug use: No   Family History  Problem Relation Age of Onset   Leukemia Mother    Diabetes Mother    Kidney disease Father        has been on dialysis for years   Diabetes Father    Hypertension Father    Neuropathy Father    Gout Father    Arthritis Maternal Grandmother    Arthritis Maternal Grandfather    Arthritis Paternal Grandmother    Arthritis Paternal Grandfather    Heart attack Paternal Grandfather        smoker   Colon cancer Neg Hx    Esophageal cancer Neg Hx    Allergies  Allergen Reactions   Amoxicillin     REACTION:  rash   Benazepril Cough   Keflex [Cephalexin]     Rash    Penicillins Rash   Current Outpatient Medications on File Prior to Visit  Medication Sig Dispense Refill   ketoconazole (NIZORAL) 2 % cream Apply to feet and in between toes nightly. (Patient not taking: Reported on 09/23/2022) 60 g 11   No current facility-administered medications on file prior to visit.    Review of Systems  Constitutional:  Negative for activity change, appetite change, fatigue, fever and unexpected weight change.  HENT:  Negative for congestion, rhinorrhea, sore throat and trouble swallowing.   Eyes:  Negative for pain, redness, itching and visual disturbance.  Respiratory:  Negative for cough, chest tightness, shortness of breath and wheezing.   Cardiovascular:  Negative for chest pain and palpitations.  Gastrointestinal:  Negative for abdominal pain, blood in stool, constipation, diarrhea and nausea.  Endocrine: Negative for cold intolerance, heat intolerance, polydipsia and polyuria.  Genitourinary:  Negative for difficulty urinating, dysuria, frequency and urgency.       No kidney stones since most recent   Musculoskeletal:  Negative for arthralgias, joint swelling and myalgias.  Skin:  Negative for pallor and rash.  Neurological:  Negative for dizziness, tremors, weakness, numbness and headaches.  Hematological:  Negative for adenopathy. Does not bruise/bleed easily.  Psychiatric/Behavioral:  Negative for decreased concentration and dysphoric mood. The patient is not nervous/anxious.        Objective:   Physical Exam Constitutional:      General: He is not in acute distress.    Appearance: Normal appearance. He is well-developed. He is obese. He is not ill-appearing or diaphoretic.  HENT:     Head: Normocephalic and atraumatic.     Right Ear: Tympanic membrane, ear canal and external ear normal.     Left Ear: Tympanic membrane, ear canal and external ear normal.     Nose: Nose normal. No  congestion.     Mouth/Throat:     Mouth: Mucous membranes are moist.     Pharynx: Oropharynx is clear. No posterior oropharyngeal erythema.  Eyes:     General: No scleral icterus.       Right eye: No discharge.        Left eye: No discharge.     Conjunctiva/sclera: Conjunctivae normal.     Pupils: Pupils are equal, round, and reactive to light.  Neck:     Thyroid: No thyromegaly.     Vascular:  No carotid bruit or JVD.  Cardiovascular:     Rate and Rhythm: Normal rate and regular rhythm.     Pulses: Normal pulses.     Heart sounds: Normal heart sounds.     No gallop.  Pulmonary:     Effort: Pulmonary effort is normal. No respiratory distress.     Breath sounds: Normal breath sounds. No wheezing or rales.     Comments: Good air exch Chest:     Chest wall: No tenderness.  Abdominal:     General: Bowel sounds are normal. There is no distension or abdominal bruit.     Palpations: Abdomen is soft. There is no mass.     Tenderness: There is no abdominal tenderness.     Hernia: No hernia is present.  Musculoskeletal:        General: No tenderness.     Cervical back: Normal range of motion and neck supple. No rigidity. No muscular tenderness.     Right lower leg: No edema.     Left lower leg: No edema.  Lymphadenopathy:     Cervical: No cervical adenopathy.  Skin:    General: Skin is warm and dry.     Coloration: Skin is not pale.     Findings: No erythema or rash.     Comments: Fair  Solar lentigines diffusely   Neurological:     Mental Status: He is alert.     Cranial Nerves: No cranial nerve deficit.     Motor: No abnormal muscle tone.     Coordination: Coordination normal.     Gait: Gait normal.     Deep Tendon Reflexes: Reflexes are normal and symmetric. Reflexes normal.  Psychiatric:        Mood and Affect: Mood normal.        Cognition and Memory: Cognition normal.           Assessment & Plan:   Problem List Items Addressed This Visit        Cardiovascular and Mediastinum   Essential hypertension    bp in fair control at this time  BP Readings from Last 1 Encounters:  09/23/22 136/80  No changes needed Most recent labs reviewed  Disc lifstyle change with low sodium diet and exercise  Labs ordered  Continue amlodipine 5 mg daily and losartan 50 mg daily       Relevant Medications   amLODipine (NORVASC) 5 MG tablet   losartan (COZAAR) 100 MG tablet   Other Relevant Orders   TSH   Lipid panel   Comprehensive metabolic panel   CBC with Differential/Platelet     Respiratory   Lung nodules     Digestive   Fatty liver    Lab today  No symptoms Enc wt loss and low fat diet   Drinks very seldomly and no tylenol recently        Other   Routine general medical examination at a health care facility - Primary    Reviewed health habits including diet and exercise and skin cancer prevention Reviewed appropriate screening tests for age  Also reviewed health mt list, fam hx and immunization status , as well as social and family history   See HPI Labs ordered Flu shot given  Colonoscopy is utd from 02/2022 with 10 y recall Utd derm care/enc sun protection  No prostate complaints  Watching lung nodules-will need f/u in spring

## 2022-09-23 NOTE — Assessment & Plan Note (Signed)
bp in fair control at this time  BP Readings from Last 1 Encounters:  09/23/22 136/80   No changes needed Most recent labs reviewed  Disc lifstyle change with low sodium diet and exercise  Labs ordered  Continue amlodipine 5 mg daily and losartan 50 mg daily

## 2022-09-23 NOTE — Assessment & Plan Note (Signed)
Reviewed health habits including diet and exercise and skin cancer prevention Reviewed appropriate screening tests for age  Also reviewed health mt list, fam hx and immunization status , as well as social and family history   See HPI Labs ordered Flu shot given  Colonoscopy is utd from 02/2022 with 10 y recall Utd derm care/enc sun protection  No prostate complaints  Watching lung nodules-will need f/u in spring

## 2022-09-23 NOTE — Patient Instructions (Addendum)
Labs today  Flu shot today   Lets re check your chest CT scan (to follow nodules) in the spring  - feb/march   Give [place] a call after the holidays and we will schedule that at an imaging center in [place]   If you develop a cough or any respiratory symptoms let us know   Work on weight loss the best you can Try to get most of your carbohydrates from produce (with the exception of white potatoes)  Eat less bread/pasta/rice/snack foods/cereals/sweets and other items from the middle of the grocery store (processed carbs)

## 2022-09-23 NOTE — Assessment & Plan Note (Signed)
Lab today  No symptoms Enc wt loss and low fat diet   Drinks very seldomly and no tylenol recently

## 2023-02-15 ENCOUNTER — Encounter: Payer: BC Managed Care – PPO | Admitting: Dermatology

## 2023-02-25 ENCOUNTER — Encounter: Payer: BC Managed Care – PPO | Admitting: Dermatology

## 2023-02-25 IMAGING — CT CT RENAL STONE PROTOCOL
2 of 4 series · 16 of 46 positions shown, 18 images · non-contrast
Comparison: None Available.

CLINICAL DATA: 46-year-old male with right lower quadrant and flank
pain radiating to the right groin.



[Series 2: stone full standard · axial · 0.95mm/px · z∈[-1063,-583]mm · 13 of 106 slices shown, 15 images]
[im 5/106  soft-tissue]
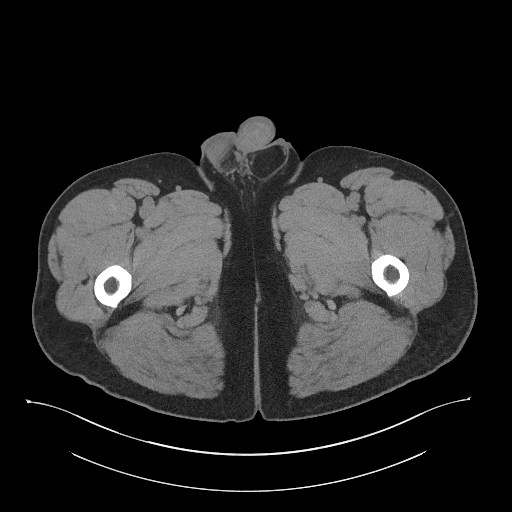
[im 5/106  bone]
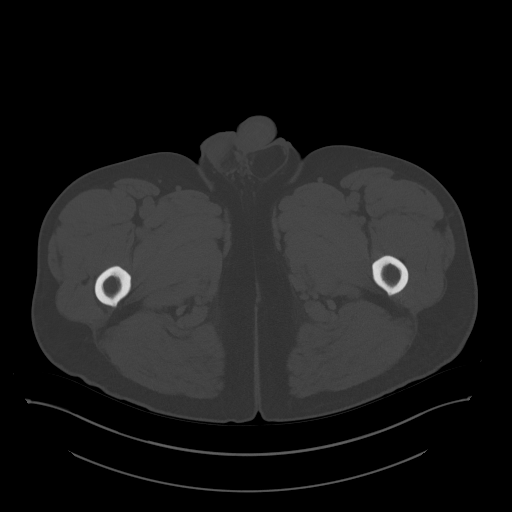
[im 14/106  soft-tissue]
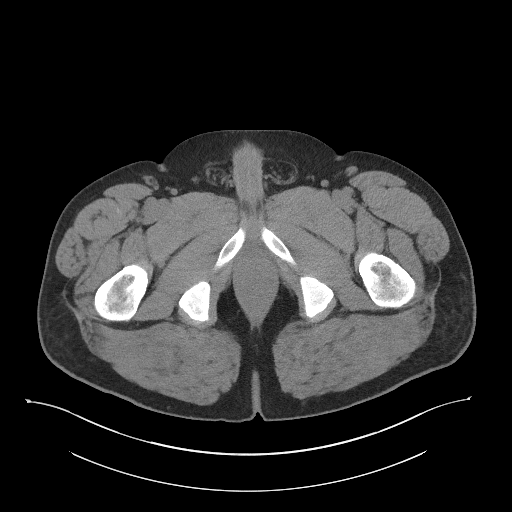
[im 22/106  soft-tissue]
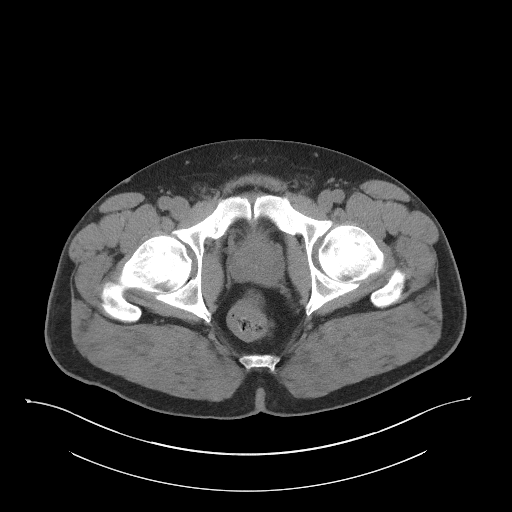
[im 31/106  soft-tissue]
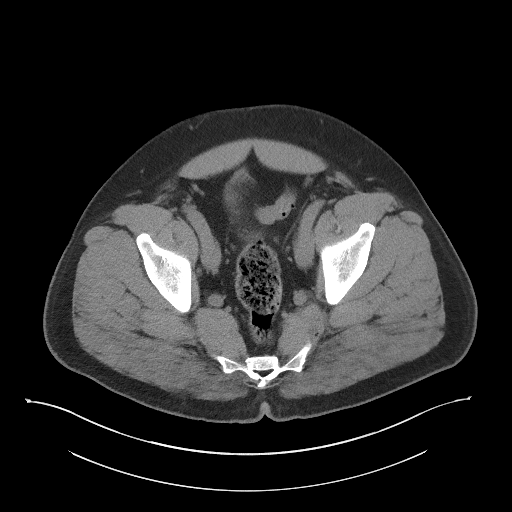
[im 36/106  soft-tissue]
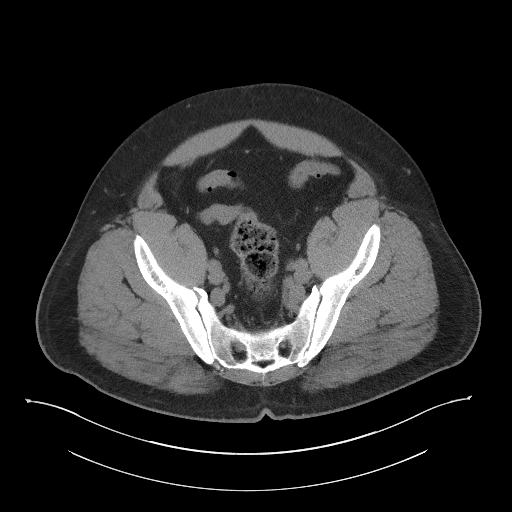
[im 44/106  soft-tissue]
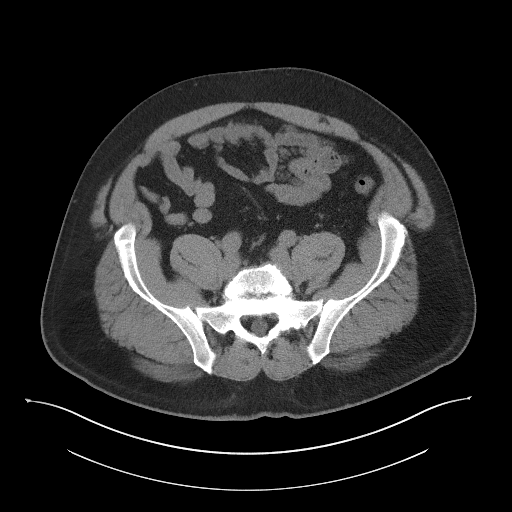
[im 53/106  soft-tissue]
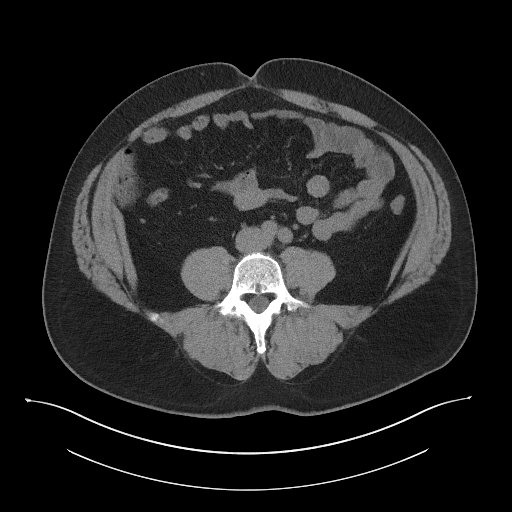
[im 62/106  soft-tissue]
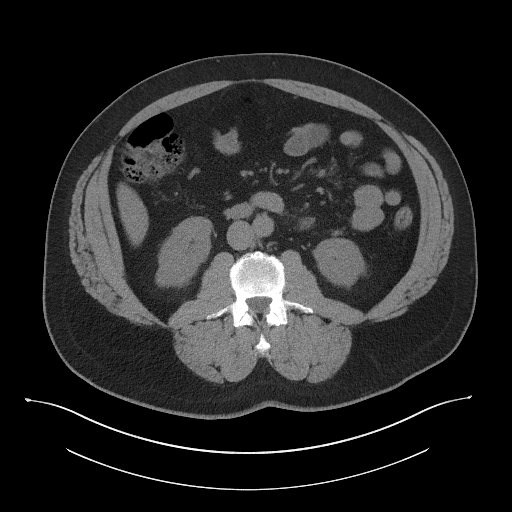
[im 71/106  soft-tissue]
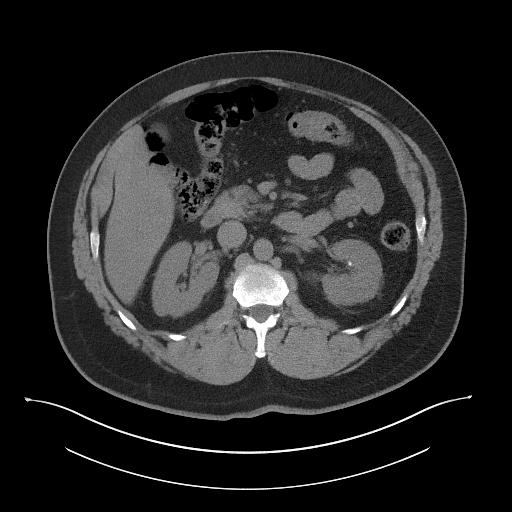
[im 71/106  bone]
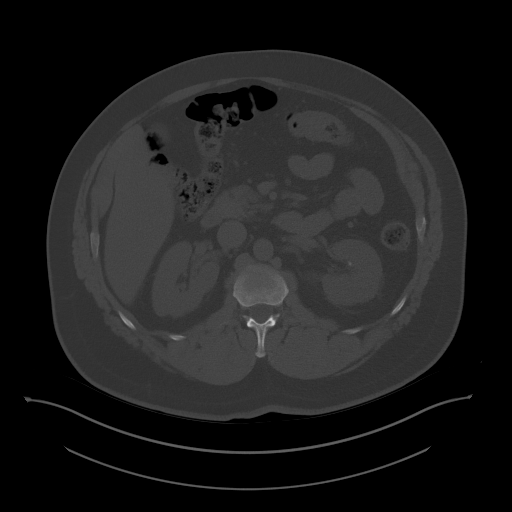
[im 75/106  soft-tissue]
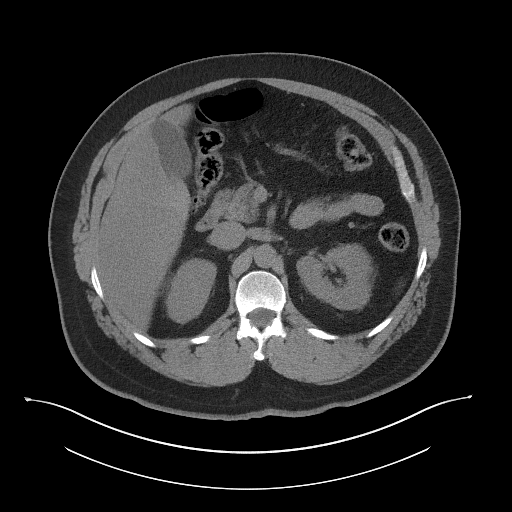
[im 84/106  soft-tissue]
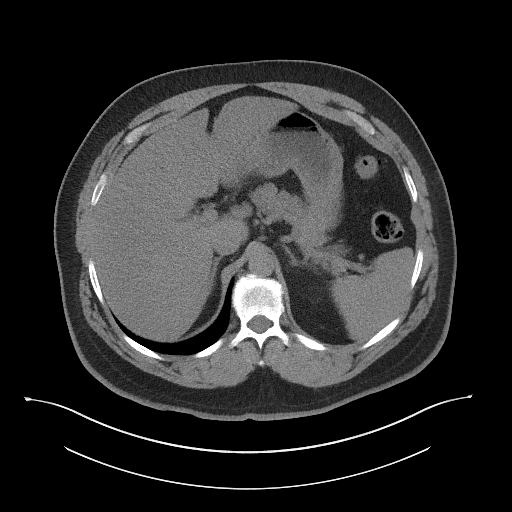
[im 92/106  soft-tissue]
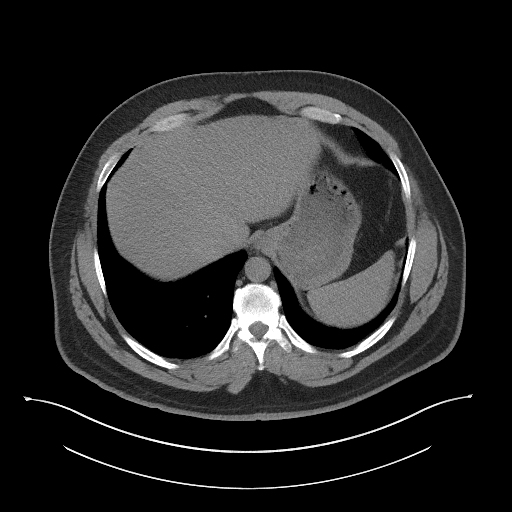
[im 101/106  soft-tissue]
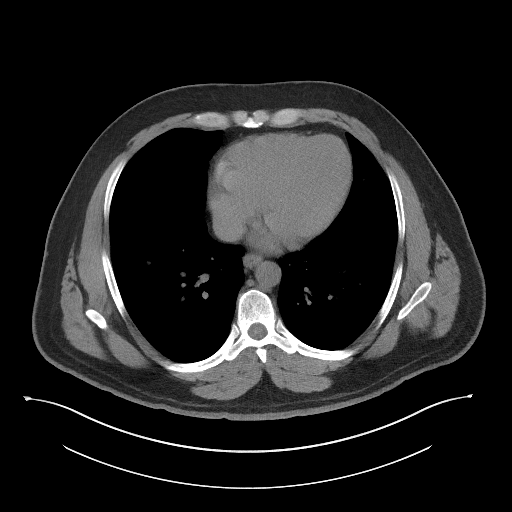

[Series 5: coronal · coronal · 0.88mm/px · 3 of 176 slices shown]
[im 59/176  soft-tissue]
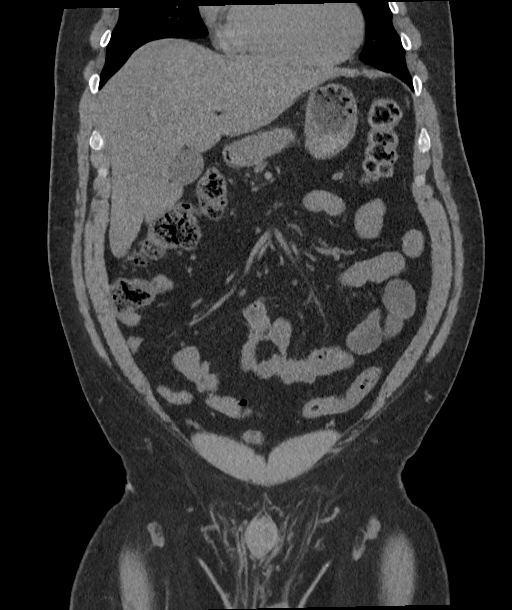
[im 78/176  soft-tissue]
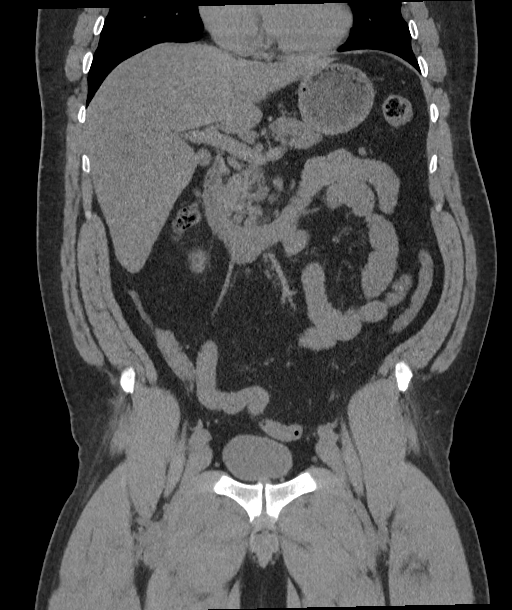
[im 98/176  soft-tissue]
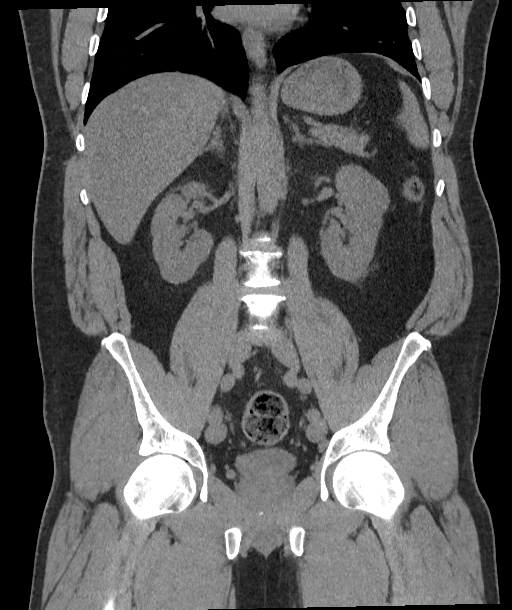

[16 of 46 positions shown; findings below may reference images not displayed]

FINDINGS: Lower chest: Small oval subpleural 6 mm left lower lobe lung nodule
series 4, image 1. There are 2 similar 5 mm or smaller right middle
and lower lobe lung nodules also visible (the larger also on image
1. No pleural or pericardial effusion. No cardiomegaly.

Hepatobiliary: Questionable hepatic steatosis. Otherwise negative
noncontrast liver and gallbladder.

Pancreas: Negative.

Spleen: Negative.

Adrenals/Urinary Tract: Normal adrenal glands.

Left nephrolithiasis with multiple small calculi up to 5 mm. No
right intrarenal calculus. No hydronephrosis or hydroureter. Both
ureters are decompressed and normal to the bladder. There are
several left hemipelvis phleboliths. No convincing pararenal
inflammation. Urinary bladder is diminutive and unremarkable.

Stomach/Bowel: Somewhat redundant large bowel in the cecum is on a
lax mesentery located in the right mid abdomen. Normal retrocecal
appendix on series 2, image 54. No large bowel inflammation.
Decompressed terminal ileum and no dilated small bowel. Negative
stomach and duodenum. No free air or free fluid.

Vascular/Lymphatic: Normal caliber abdominal aorta. No calcified
atherosclerosis or lymphadenopathy identified.

Reproductive: Negative.

Other: No pelvic free fluid.

Musculoskeletal: Chronic disc and endplate degeneration in the lower
thoracic and lower lumbar spine. No acute osseous abnormality
identified.
IMPRESSION: 1. Left nephrolithiasis.  But no obstructive uropathy.

2. Normal appendix. And no acute or inflammatory process identified
in the non-contrast abdomen or pelvis.

3. Multiple small pulmonary nodules at the lung bases measuring up
to 6 mm. Recommend a non-contrast Chest CT at 6-12 months. If
patient is high risk for malignancy, recommend an additional
non-contrast Chest CT at 18-24 months; if patient is low risk for
malignancy a non-contrast Chest CT at 18-24 months is optional.
Reference: Radiology. 3866; 284(1):228-43.

## 2023-02-25 IMAGING — US US ABDOMEN LIMITED
1 series · 14 of 25 positions shown · non-contrast
Comparison: CT Abdomen and Pelvis 0307 hours today.

CLINICAL DATA: 46-year-old male with right abdominal pain radiating
to the groin. Unrevealing noncontrast CT Abdomen and Pelvis.

EXAM:
ULTRASOUND ABDOMEN LIMITED RIGHT UPPER QUADRANT

[Series 1: us abdomen limited ruq (liver/gb) · 14 of 45 slices shown]
[im 1/45]
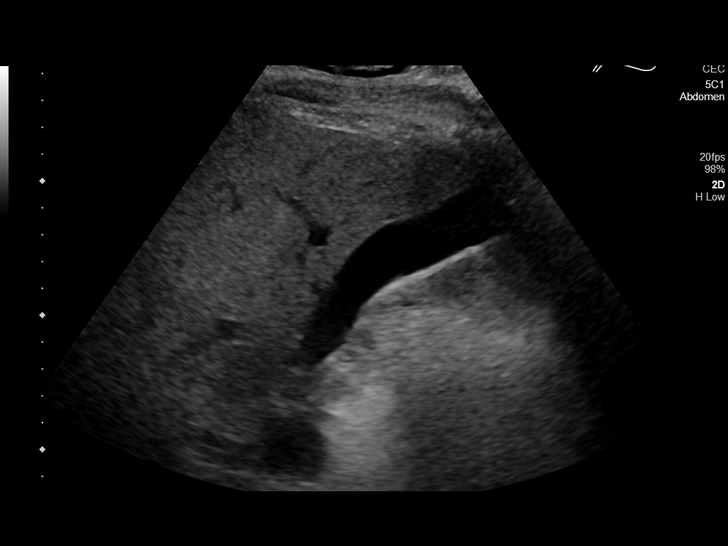
[im 4/45]
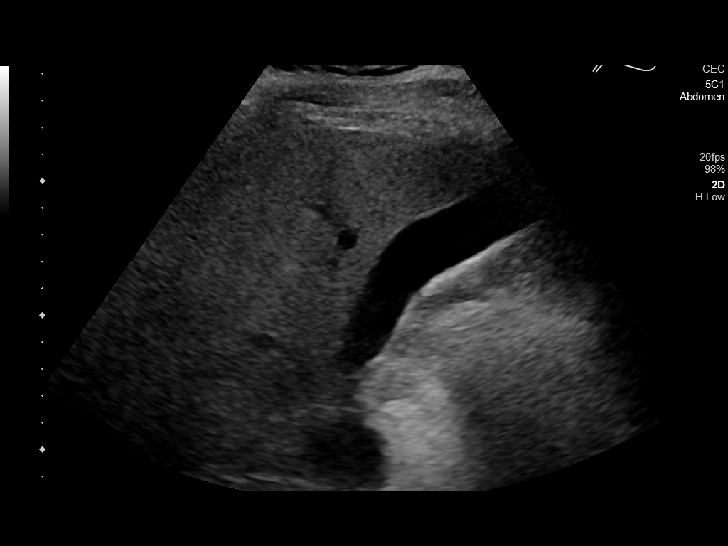
[im 8/45]
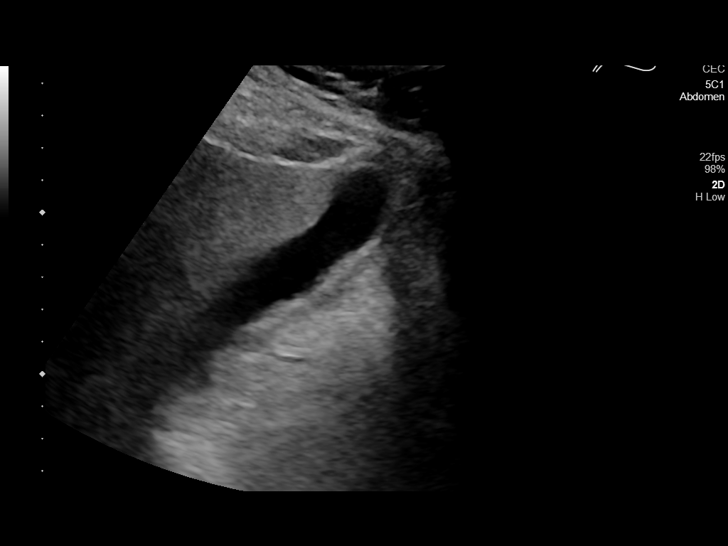
[im 12/45]
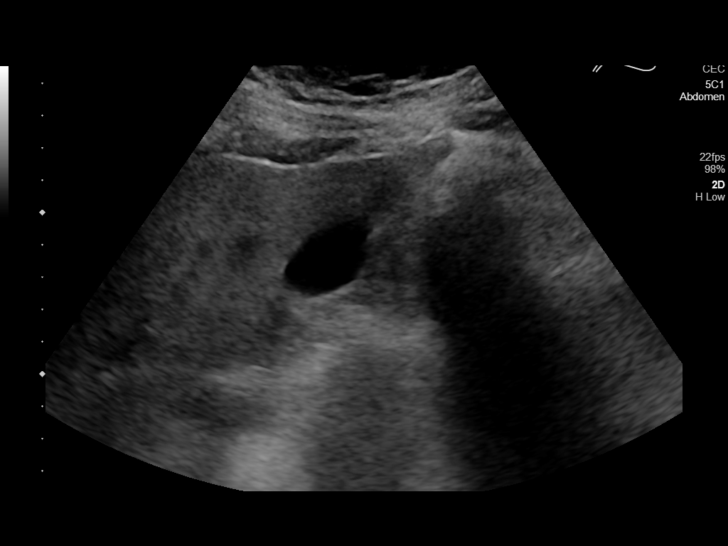
[im 15/45]
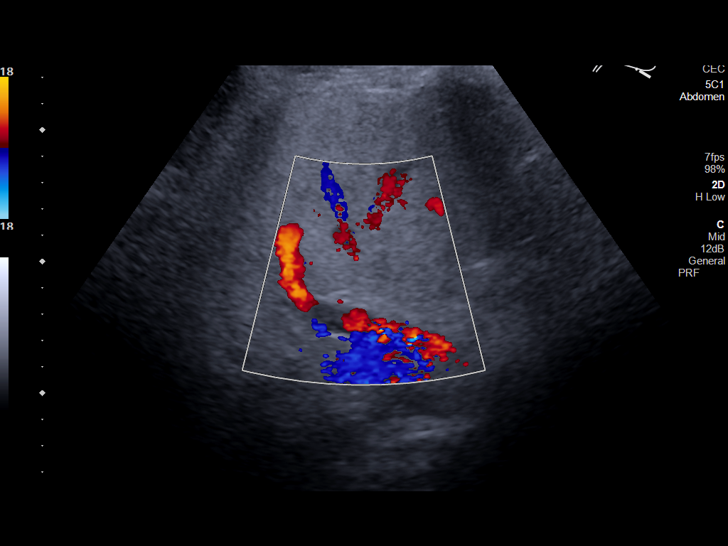
[im 17/45]
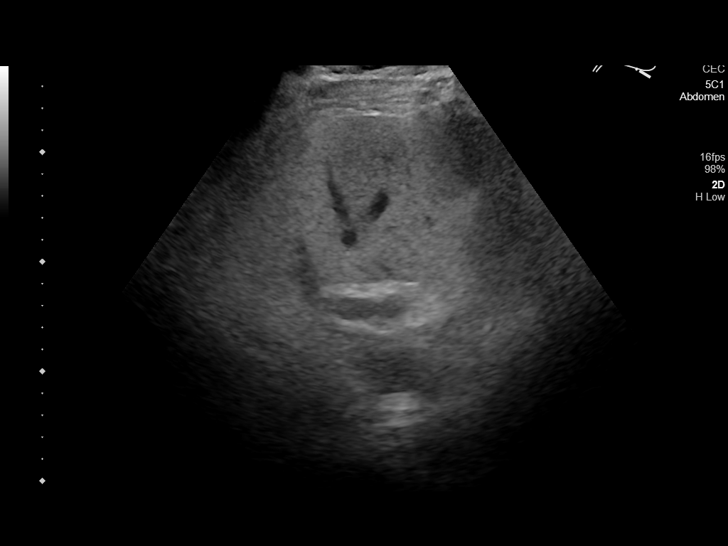
[im 21/45]
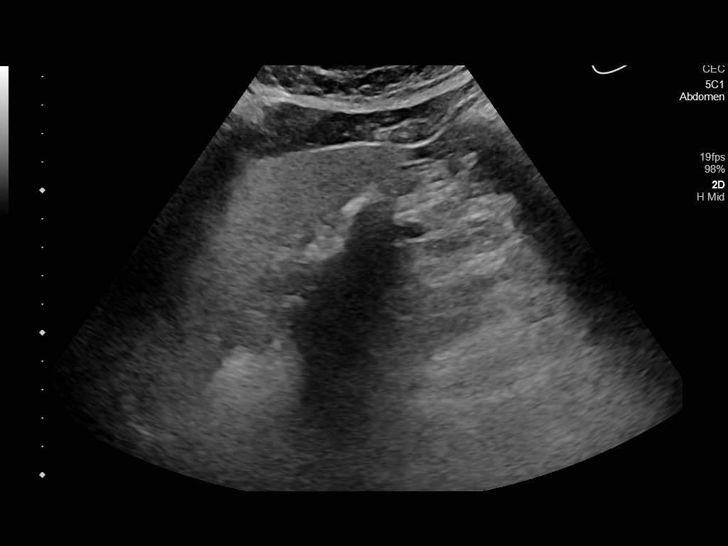
[im 24/45]
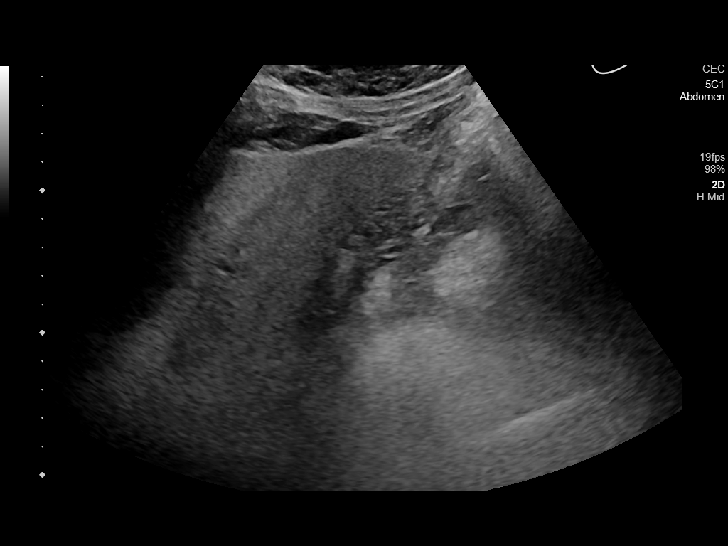
[im 28/45]
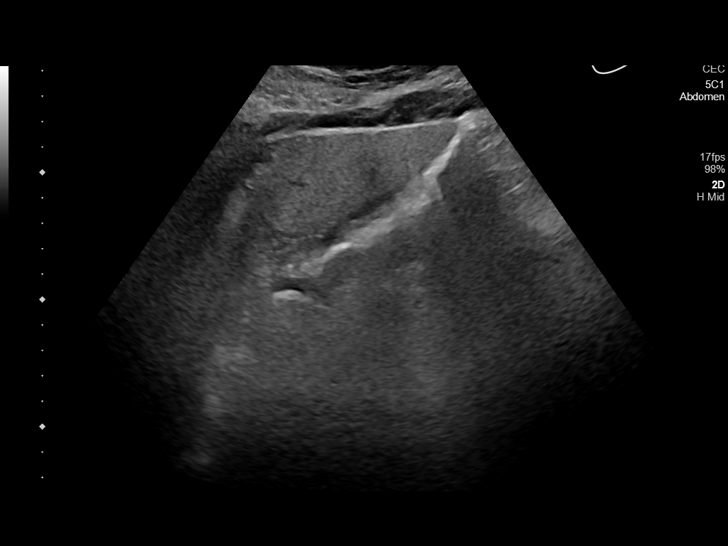
[im 30/45]
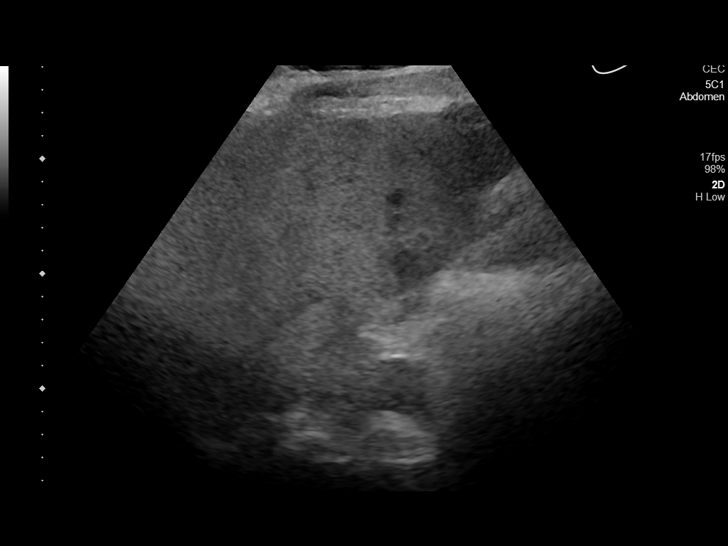
[im 34/45]
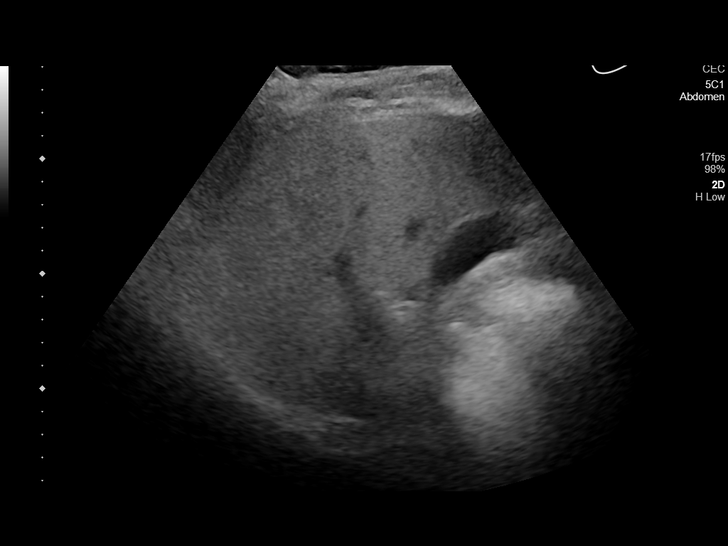
[im 37/45]
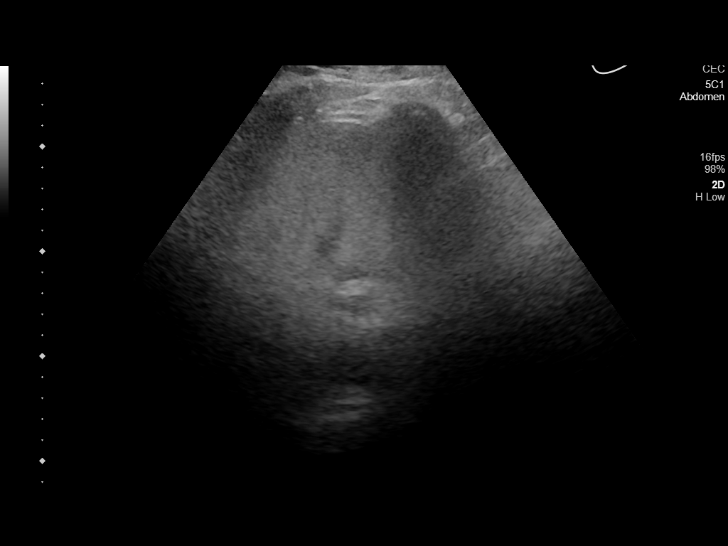
[im 41/45]
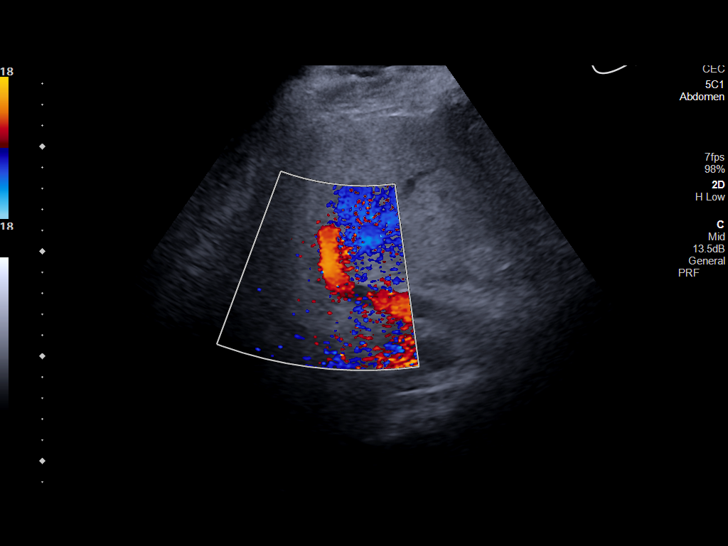
[im 45/45]
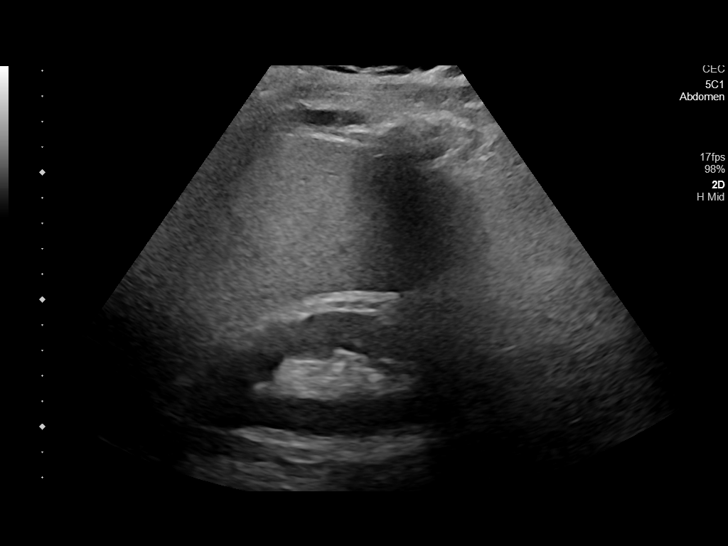

[14 of 25 positions shown; findings below may reference images not displayed]

FINDINGS: Gallbladder:

No gallstones or wall thickening visualized. No sonographic Murphy
sign noted by sonographer.

Common bile duct:

Diameter: 4 mm, normal.

Liver:

Echogenic liver (image 44). No discrete liver lesion. Portal vein is
patent on color Doppler imaging with normal direction of blood flow
towards the liver.

Other: Negative visible right kidney.  No free fluid.
IMPRESSION: Fatty liver.  But otherwise normal right upper quadrant ultrasound.

## 2023-03-03 ENCOUNTER — Telehealth: Payer: Self-pay | Admitting: Family Medicine

## 2023-03-03 DIAGNOSIS — R918 Other nonspecific abnormal finding of lung field: Secondary | ICD-10-CM

## 2023-03-03 NOTE — Telephone Encounter (Signed)
I got a reminder that he is due for a CT scan soon to re check lung nodules   Ok if I put in the order? Does he pref [place] or [place]?   Thanks

## 2023-03-04 NOTE — Telephone Encounter (Signed)
Left VM requesting pt to call the office back or send [place] a mychart asking if it's okay to get CT scan ordered and if so what city

## 2023-03-05 NOTE — Telephone Encounter (Signed)
Left message to return call to our office.  

## 2023-03-09 NOTE — Telephone Encounter (Signed)
The order is in

## 2023-03-09 NOTE — Addendum Note (Signed)
Addended by: Roxy Manns A on: 03/09/2023 07:12 PM   Modules accepted: Orders

## 2023-03-09 NOTE — Telephone Encounter (Signed)
Spoke to patient he would like order placed to [place]. Advised him to let us know if has not received all in 2 weeks to get set up.

## 2023-03-29 ENCOUNTER — Ambulatory Visit
Admission: RE | Admit: 2023-03-29 | Discharge: 2023-03-29 | Disposition: A | Discharge status: Home / Self Care | Payer: BC Managed Care – PPO | Source: Ambulatory Visit | Attending: Family Medicine | Admitting: Family Medicine

## 2023-03-29 DIAGNOSIS — R918 Other nonspecific abnormal finding of lung field: Secondary | ICD-10-CM

## 2023-03-29 DIAGNOSIS — R911 Solitary pulmonary nodule: Secondary | ICD-10-CM | POA: Diagnosis not present

## 2023-07-05 ENCOUNTER — Ambulatory Visit (INDEPENDENT_AMBULATORY_CARE_PROVIDER_SITE_OTHER): Payer: BC Managed Care – PPO | Admitting: Family Medicine

## 2023-07-05 ENCOUNTER — Encounter: Payer: Self-pay | Admitting: Family Medicine

## 2023-07-05 VITALS — BP 100/60 | HR 66 | Temp 98.4°F | Ht 71.65 in | Wt 272.1 lb

## 2023-07-05 DIAGNOSIS — R0781 Pleurodynia: Secondary | ICD-10-CM | POA: Diagnosis not present

## 2023-07-05 DIAGNOSIS — U071 COVID-19: Secondary | ICD-10-CM

## 2023-07-05 DIAGNOSIS — R051 Acute cough: Secondary | ICD-10-CM

## 2023-07-05 LAB — POC COVID19 BINAXNOW: SARS Coronavirus 2 Ag: POSITIVE — AB

## 2023-07-05 NOTE — Progress Notes (Signed)
Yolander Goodie T. Taten Merrow, MD, CAQ Sports Medicine [facility] [address] [place], [address]  Phone: [phone]  FAX: [phone]  Raymond Fare - 47 y.o. male  MRN [id number]  Date of Birth: 1976/11/05  Date: 07/05/2023  PCP: Judy Pimple, MD  Referral: Judy Pimple, MD  Chief Complaint  Patient presents with   Fall    Last Night   Rib Injury    Right Side   Cough    Symptoms started Friday evening   Nasal Congestion    Sneezing    Subjective:   Raymond West is a 47 y.o. very pleasant male patient with Body mass index is 37.27 kg/m. who presents with the following:  Patient presents after falling down an embankment last night, and he landed on his right side.  Subsequently he has developed some right-sided and anterior chest pain.  This is specific to the chest wall.  He has pain with taking a deep breath and coughing.  Also has pain with rotation.  He does not have any bruising or swelling.  He also has some nasal congestion, cough, sneezing, and multiple other family members have been sick.  He is not sure of their COVID status.  He works as an Geneticist, molecular.  Review of Systems is noted in the HPI, as appropriate  Objective:   BP 100/60 (BP Location: Right Arm, Patient Position: Sitting, Cuff Size: Large)   Pulse 66   Temp 98.4 F (36.9 C) (Temporal)   Ht 5' 11.65" (1.82 m)   Wt 272 lb 2 oz (123.4 kg)   SpO2 98%   BMI 37.27 kg/m    Gen: WDWN, NAD. Globally Non-toxic HEENT: Throat clear, w/o exudate, R TM clear, L TM - good landmarks, No fluid present. rhinnorhea.  MMM Frontal sinuses: NT Max sinuses: NT NECK: Anterior cervical  LAD is absent CV: RRR, No M/G/R, cap refill <2 sec PULM: Breathing comfortably in no respiratory distress. no wheezing, crackles, rhonchi   Chest wall: Sternum is nontender.  He does have some pain roughly in rib area 8 and 9 on the right side.  This is anterior.  No pain on the left  ribs.  Laboratory and Imaging Data:  Assessment and Plan:     ICD-10-CM   1. Rib pain on right side  R07.81     2. Acute cough  R05.1 POC COVID-19    3. COVID-19  U07.1      Anticipate 1-2 rib fractures on the right anterior chest and rib wall.  Acute COVID-19.  At this point, we are going to treat this expectantly with supportive care.  He can return to work full duty tomorrow without limitations.  He is going to try to work with his rib fractures and see how it goes, he does think he is can to be able to do most of his work activities without limitation.  Orders placed today for conditions managed today: Orders Placed This Encounter  Procedures   POC COVID-19    Disposition: No follow-ups on file.  [employer] speech-to-text software was used for transcription in this dictation.  Possible transcriptional errors can occur using Animal nutritionist.   Signed,  Elpidio Galea. Jaken Fregia, MD   Outpatient Encounter Medications as of 07/05/2023  Medication Sig   amLODipine (NORVASC) 5 MG tablet Take 1 tablet (5 mg total) by mouth daily.   ketoconazole (NIZORAL) 2 % cream Apply to feet and in between  toes nightly. (Patient not taking: Reported on 09/23/2022)   losartan (COZAAR) 100 MG tablet Take 0.5 tablets (50 mg total) by mouth daily.   No facility-administered encounter medications on file as of 07/05/2023.

## 2023-07-07 DIAGNOSIS — M19012 Primary osteoarthritis, left shoulder: Secondary | ICD-10-CM | POA: Diagnosis not present

## 2023-07-08 DIAGNOSIS — M19012 Primary osteoarthritis, left shoulder: Secondary | ICD-10-CM | POA: Diagnosis not present

## 2023-10-05 NOTE — Progress Notes (Unsigned)
Subjective:    Patient ID: Raymond West, male    DOB: 21-Feb-1976, 47 y.o.   MRN: [id number]  HPI  Here for health maintenance exam and to review chronic medical problems   Wt Readings from Last 3 Encounters:  10/06/23 270 lb 6 oz (122.6 kg)  07/05/23 272 lb 2 oz (123.4 kg)  09/23/22 269 lb 12.8 oz (122.4 kg)   38.52 kg/m  Vitals:   10/06/23 0827 10/06/23 0853  BP: 136/86 120/80  Pulse: 60   Temp: 97.7 F (36.5 C)   SpO2: 97%     Immunization History  Administered Date(s) Administered   Influenza, Seasonal, Injecte, Preservative Fre 10/06/2023   Influenza,inj,Quad PF,6+ Mos 12/01/2013, 08/27/2017, 08/04/2018, 09/19/2019, 09/20/2020, 09/22/2021, 09/23/2022   PFIZER(Purple Top)SARS-COV-2 Vaccination 02/23/2020, 03/15/2020   Pneumococcal Polysaccharide-23 08/26/2018   Td 08/04/2010   Tdap 11/02/2019    Health Maintenance Due  Topic Date Due   Hepatitis C Screening  Never done    Feeling good  Takes care of himself   Hep C screen   Flu shot -today    Prostate health No nocturia or voiding changes  No family history    Colon cancer screening - colonoscopy due 02/2032  Bone health   Falls-had a fall and fractured ribs - fell in a ditch in the dark (looking at phone) Fractures-ribs  Healed up well   Supplements  None    Exercise :  Physical job  Lot of heavy lifting  Farm work also  Would like to do more   Considered buying a bike     Mood    10/06/2023    8:32 AM 09/23/2022    9:36 AM 09/22/2021    4:13 PM 09/20/2020    3:56 PM 09/19/2019    3:57 PM  Depression screen PHQ 2/9  Decreased Interest 0 0 0 0 0  Down, Depressed, Hopeless 0 0 0 0 0  PHQ - 2 Score 0 0 0 0 0  Altered sleeping 0  0 0 0  Tired, decreased energy 0  0 2 1  Change in appetite 0  0 0 0  Feeling bad or failure about yourself  0  0 0 0  Trouble concentrating 0  0 0 0  Moving slowly or fidgety/restless 0  0 0 0  Suicidal thoughts 0  0 0 0  PHQ-9 Score 0  0 2 1   Difficult doing work/chores Not difficult at all  Not difficult at all Not difficult at all Not difficult at all   HTN bp is stable today  No cp or palpitations or headaches or edema  No side effects to medicines  BP Readings from Last 3 Encounters:  10/06/23 120/80  07/05/23 100/60  09/23/22 136/80    Amlodipine 5 mg daily  Losartan 50 mg daily   Eating well  More greens  Ate [place] food last night    Pulse Readings from Last 3 Encounters:  10/06/23 60  07/05/23 66  09/23/22 60   Had lung nodules on CT (low risk) 03/2023  Recommended re check 18-24 months if high risk  History of fatty liver Lab Results  Component Value Date   ALT 42 09/23/2022   AST 32 09/23/2022   ALKPHOS 62 09/23/2022   BILITOT 0.7 09/23/2022    Cholesterol Lab Results  Component Value Date   CHOL 183 09/23/2022   HDL 39.00 (L) 09/23/2022   LDLCALC 128 (H) 09/23/2022   LDLDIRECT 118.5 05/29/2013  TRIG 80.0 09/23/2022   CHOLHDL 5 09/23/2022   Due for labs    Had beef last night  Eats more chicken than beef  Tries to avoid fried foods         Patient Active Problem List   Diagnosis Date Noted   Encounter for hepatitis C screening test for low risk patient 10/06/2023   Fatty liver 03/17/2022   Lung nodules 03/17/2022   Colon cancer screening 09/22/2021   H/O: pneumonia 03/18/2018   Class 2 severe obesity due to excess calories with serious comorbidity and body mass index (BMI) of 38.0 to 38.9 in adult [facility]) 06/26/2016   Athlete's foot 06/08/2014   Routine general medical examination at a health care facility 05/27/2012   Essential hypertension 01/15/2010   Past Medical History:  Diagnosis Date   Dysplastic nevus 04/02/2010   right upper back, 3.0 cm lat to spine, moderate to severe atypia    Neoplasm of uncertain behavior of skin    Unspecified essential hypertension    Past Surgical History:  Procedure Laterality Date   NO PAST SURGERIES     Social History    Tobacco Use   Smoking status: Never   Smokeless tobacco: Never  Vaping Use   Vaping status: Never Used  Substance Use Topics   Alcohol use: Yes    Alcohol/week: 0.0 standard drinks of alcohol    Comment: rarely   Drug use: No   Family History  Problem Relation Age of Onset   Leukemia Mother    Diabetes Mother    Kidney disease Father        has been on dialysis for years   Diabetes Father    Hypertension Father    Neuropathy Father    Gout Father    Arthritis Maternal Grandmother    Arthritis Maternal Grandfather    Arthritis Paternal Grandmother    Arthritis Paternal Grandfather    Heart attack Paternal Grandfather        smoker   Colon cancer Neg Hx    Esophageal cancer Neg Hx    Allergies  Allergen Reactions   Amoxicillin     REACTION: rash   Benazepril Cough   Keflex [Cephalexin]     Rash    Penicillins Rash   No current outpatient medications on file prior to visit.   No current facility-administered medications on file prior to visit.    Review of Systems  Constitutional:  Negative for activity change, appetite change, fatigue, fever and unexpected weight change.  HENT:  Negative for congestion, rhinorrhea, sore throat and trouble swallowing.   Eyes:  Negative for pain, redness, itching and visual disturbance.  Respiratory:  Negative for cough, chest tightness, shortness of breath and wheezing.   Cardiovascular:  Negative for chest pain and palpitations.  Gastrointestinal:  Negative for abdominal pain, blood in stool, constipation, diarrhea and nausea.  Endocrine: Negative for cold intolerance, heat intolerance, polydipsia and polyuria.  Genitourinary:  Negative for difficulty urinating, dysuria, frequency and urgency.  Musculoskeletal:  Negative for arthralgias, joint swelling and myalgias.  Skin:  Negative for pallor and rash.  Neurological:  Negative for dizziness, tremors, weakness, numbness and headaches.  Hematological:  Negative for  adenopathy. Does not bruise/bleed easily.  Psychiatric/Behavioral:  Negative for decreased concentration and dysphoric mood. The patient is not nervous/anxious.        Objective:   Physical Exam Constitutional:      General: He is not in acute distress.    Appearance: Normal  appearance. He is well-developed. He is obese. He is not ill-appearing or diaphoretic.  HENT:     Head: Normocephalic and atraumatic.     Right Ear: Tympanic membrane, ear canal and external ear normal.     Left Ear: Tympanic membrane, ear canal and external ear normal.     Nose: Nose normal. No congestion.     Mouth/Throat:     Mouth: Mucous membranes are moist.     Pharynx: Oropharynx is clear. No posterior oropharyngeal erythema.  Eyes:     General: No scleral icterus.       Right eye: No discharge.        Left eye: No discharge.     Conjunctiva/sclera: Conjunctivae normal.     Pupils: Pupils are equal, round, and reactive to light.  Neck:     Thyroid: No thyromegaly.     Vascular: No carotid bruit or JVD.  Cardiovascular:     Rate and Rhythm: Normal rate and regular rhythm.     Pulses: Normal pulses.     Heart sounds: Normal heart sounds.     No gallop.  Pulmonary:     Effort: Pulmonary effort is normal. No respiratory distress.     Breath sounds: Normal breath sounds. No wheezing or rales.     Comments: Good air exch Chest:     Chest wall: No tenderness.  Abdominal:     General: Bowel sounds are normal. There is no distension or abdominal bruit.     Palpations: Abdomen is soft. There is no mass.     Tenderness: There is no abdominal tenderness.     Hernia: No hernia is present.  Musculoskeletal:        General: No tenderness.     Cervical back: Normal range of motion and neck supple. No rigidity. No muscular tenderness.     Right lower leg: No edema.     Left lower leg: No edema.  Lymphadenopathy:     Cervical: No cervical adenopathy.  Skin:    General: Skin is warm and dry.      Coloration: Skin is not pale.     Findings: No erythema or rash.     Comments: Ruddy complexion Solar lentigines diffusely   Neurological:     Mental Status: He is alert.     Cranial Nerves: No cranial nerve deficit.     Motor: No abnormal muscle tone.     Coordination: Coordination normal.     Gait: Gait normal.     Deep Tendon Reflexes: Reflexes are normal and symmetric. Reflexes normal.  Psychiatric:        Mood and Affect: Mood normal.        Cognition and Memory: Cognition and memory normal.           Assessment & Plan:   Problem List Items Addressed This Visit       Cardiovascular and Mediastinum   Essential hypertension    bp in fair control at this time  BP Readings from Last 1 Encounters:  10/06/23 120/80   No changes needed Most recent labs reviewed  Disc lifstyle change with low sodium diet and exercise  Labs ordered  Continue amlodipine 5 mg daily and losartan 50 mg daily       Relevant Medications   amLODipine (NORVASC) 5 MG tablet   losartan (COZAAR) 100 MG tablet   Other Relevant Orders   CBC with Differential/Platelet   Comprehensive metabolic panel   Lipid Panel  TSH     Respiratory   Lung nodules    CT 03/2023  Recommended follow up 18-24 months out if high risk (pt is low risk)        Digestive   Fatty liver    LFTs today Weight loss encouraged       Relevant Orders   Comprehensive metabolic panel     Musculoskeletal and Integument   Athlete's foot    Pt ran out fo ketoconazole  Exam consistent with worsened skin condition Refilled this  Encouraged to keep feet clean and dry  Start back with daily use       Relevant Medications   ketoconazole (NIZORAL) 2 % cream     Other   Routine general medical examination at a health care facility - Primary    Reviewed health habits including diet and exercise and skin cancer prevention Reviewed appropriate screening tests for age  Also reviewed health mt list, fam hx and  immunization status , as well as social and family history   See HPI Labs reviewed and ordered Hep C screen today with wellness labs Flu shot given  Colonoscopy due 02/2032 No prostate concerns Discussed fall prevention, supplements and exercise for bone density  PHq 0  Health Maintenance  Topic Date Due   Hepatitis C Screening  Never done   COVID-19 Vaccine (3 - 2023-24 season) 10/21/2025*   DTaP/Tdap/Td vaccine (3 - Td or Tdap) 11/01/2029   Colon Cancer Screening  03/02/2032   Flu Shot  Completed   HIV Screening  Completed   HPV Vaccine  Aged Out  *Topic was postponed. The date shown is not the original due date.         Encounter for hepatitis C screening test for low risk patient    Hep C screen today      Relevant Orders   Hepatitis C Antibody   Colon cancer screening    Colonoscopy due 02/2032      Class 2 severe obesity due to excess calories with serious comorbidity and body mass index (BMI) of 38.0 to 38.9 in adult [facility])    Discussed how this problem influences overall health and the risks it imposes  Reviewed plan for weight loss with lower calorie diet (via better food choices (lower glycemic and portion control) along with exercise building up to or more than 30 minutes 5 days per week including some aerobic activity and strength training         Other Visit Diagnoses     Need for influenza vaccination       Relevant Orders   Flu vaccine trivalent PF, 6mos and older(Flulaval,Afluria,Fluarix,Fluzone) (Completed)

## 2023-10-06 ENCOUNTER — Encounter: Payer: Self-pay | Admitting: Family Medicine

## 2023-10-06 ENCOUNTER — Ambulatory Visit (INDEPENDENT_AMBULATORY_CARE_PROVIDER_SITE_OTHER): Payer: BC Managed Care – PPO | Admitting: Family Medicine

## 2023-10-06 VITALS — BP 120/80 | HR 60 | Temp 97.7°F | Ht 70.25 in | Wt 270.4 lb

## 2023-10-06 DIAGNOSIS — Z23 Encounter for immunization: Secondary | ICD-10-CM

## 2023-10-06 DIAGNOSIS — K760 Fatty (change of) liver, not elsewhere classified: Secondary | ICD-10-CM | POA: Diagnosis not present

## 2023-10-06 DIAGNOSIS — Z1211 Encounter for screening for malignant neoplasm of colon: Secondary | ICD-10-CM

## 2023-10-06 DIAGNOSIS — Z0000 Encounter for general adult medical examination without abnormal findings: Secondary | ICD-10-CM

## 2023-10-06 DIAGNOSIS — Z1159 Encounter for screening for other viral diseases: Secondary | ICD-10-CM | POA: Diagnosis not present

## 2023-10-06 DIAGNOSIS — E66812 Obesity, class 2: Secondary | ICD-10-CM

## 2023-10-06 DIAGNOSIS — R918 Other nonspecific abnormal finding of lung field: Secondary | ICD-10-CM

## 2023-10-06 DIAGNOSIS — Z6838 Body mass index (BMI) 38.0-38.9, adult: Secondary | ICD-10-CM

## 2023-10-06 DIAGNOSIS — I10 Essential (primary) hypertension: Secondary | ICD-10-CM

## 2023-10-06 DIAGNOSIS — B353 Tinea pedis: Secondary | ICD-10-CM

## 2023-10-06 LAB — COMPREHENSIVE METABOLIC PANEL
ALT: 47 U/L (ref 0–53)
AST: 30 U/L (ref 0–37)
Albumin: 4.4 g/dL (ref 3.5–5.2)
Alkaline Phosphatase: 66 U/L (ref 39–117)
BUN: 16 mg/dL (ref 6–23)
CO2: 30 meq/L (ref 19–32)
Calcium: 9.2 mg/dL (ref 8.4–10.5)
Chloride: 104 meq/L (ref 96–112)
Creatinine, Ser: 0.93 mg/dL (ref 0.40–1.50)
GFR: 97.61 mL/min (ref >60.00)
Glucose, Bld: 100 mg/dL — ABNORMAL HIGH (ref 70–99)
Potassium: 4.1 meq/L (ref 3.5–5.1)
Sodium: 139 meq/L (ref 135–145)
Total Bilirubin: 0.8 mg/dL (ref 0.2–1.2)
Total Protein: 6.7 g/dL (ref 6.0–8.3)

## 2023-10-06 LAB — CBC WITH DIFFERENTIAL/PLATELET
Basophils Absolute: 0.1 10*3/uL (ref 0.0–0.1)
Basophils Relative: 0.7 % (ref 0.0–3.0)
Eosinophils Absolute: 0.2 10*3/uL (ref 0.0–0.7)
Eosinophils Relative: 2.8 % (ref 0.0–5.0)
HCT: 46.1 % (ref 39.0–52.0)
Hemoglobin: 15.5 g/dL (ref 13.0–17.0)
Lymphocytes Relative: 31.2 % (ref 12.0–46.0)
Lymphs Abs: 2.7 10*3/uL (ref 0.7–4.0)
MCHC: 33.6 g/dL (ref 30.0–36.0)
MCV: 88.7 fL (ref 78.0–100.0)
Monocytes Absolute: 0.7 10*3/uL (ref 0.1–1.0)
Monocytes Relative: 7.7 % (ref 3.0–12.0)
Neutro Abs: 5 10*3/uL (ref 1.4–7.7)
Neutrophils Relative %: 57.6 % (ref 43.0–77.0)
Platelets: 211 10*3/uL (ref 150.0–400.0)
RBC: 5.2 Mil/uL (ref 4.22–5.81)
RDW: 13.9 % (ref 11.5–15.5)
WBC: 8.6 10*3/uL (ref 4.0–10.5)

## 2023-10-06 LAB — LIPID PANEL
Cholesterol: 187 mg/dL (ref 0–200)
HDL: 38.4 mg/dL — ABNORMAL LOW (ref >39.00)
LDL Cholesterol: 128 mg/dL — ABNORMAL HIGH (ref 0–99)
NonHDL: 148.77
Total CHOL/HDL Ratio: 5
Triglycerides: 104 mg/dL (ref 0.0–149.0)
VLDL: 20.8 mg/dL (ref 0.0–40.0)

## 2023-10-06 LAB — TSH: TSH: 2.59 u[IU]/mL (ref 0.35–5.50)

## 2023-10-06 MED ORDER — AMLODIPINE BESYLATE 5 MG PO TABS: 5.0000 mg | ORAL_TABLET | Freq: Every day | ORAL | 3 refills | Status: DC

## 2023-10-06 MED ORDER — KETOCONAZOLE 2 % EX CREA: TOPICAL_CREAM | CUTANEOUS | 11 refills | Status: DC

## 2023-10-06 MED ORDER — LOSARTAN POTASSIUM 100 MG PO TABS: 50.0000 mg | ORAL_TABLET | Freq: Every day | ORAL | 3 refills | Status: DC

## 2023-10-06 NOTE — Assessment & Plan Note (Signed)
Pt ran out fo ketoconazole  Exam consistent with worsened skin condition Refilled this  Encouraged to keep feet clean and dry  Start back with daily use

## 2023-10-06 NOTE — Patient Instructions (Signed)
Labs today   Flu shot today   Take care of yourself  Use sun protection  Stay active    Work on weight loss with healthy diet and exercise  Try to get most of your carbohydrates from produce (with the exception of white potatoes) and whole grains Eat less bread/pasta/rice/snack foods/cereals/sweets and other items from the middle of the grocery store (processed carbs)

## 2023-10-06 NOTE — Assessment & Plan Note (Signed)
Reviewed health habits including diet and exercise and skin cancer prevention Reviewed appropriate screening tests for age  Also reviewed health mt list, fam hx and immunization status , as well as social and family history   See HPI Labs reviewed and ordered Hep C screen today with wellness labs Flu shot given  Colonoscopy due 02/2032 No prostate concerns Discussed fall prevention, supplements and exercise for bone density  PHq 0  Health Maintenance  Topic Date Due   Hepatitis C Screening  Never done   COVID-19 Vaccine (3 - 2023-24 season) 10/21/2025*   DTaP/Tdap/Td vaccine (3 - Td or Tdap) 11/01/2029   Colon Cancer Screening  03/02/2032   Flu Shot  Completed   HIV Screening  Completed   HPV Vaccine  Aged Out  *Topic was postponed. The date shown is not the original due date.

## 2023-10-06 NOTE — Assessment & Plan Note (Signed)
 Discussed how this problem influences overall health and the risks it imposes  Reviewed plan for weight loss with lower calorie diet (via better food choices (lower glycemic and portion control) along with exercise building up to or more than 30 minutes 5 days per week including some aerobic activity and strength training

## 2023-10-06 NOTE — Assessment & Plan Note (Signed)
bp in fair control at this time  BP Readings from Last 1 Encounters:  10/06/23 120/80   No changes needed Most recent labs reviewed  Disc lifstyle change with low sodium diet and exercise  Labs ordered  Continue amlodipine 5 mg daily and losartan 50 mg daily

## 2023-10-06 NOTE — Assessment & Plan Note (Signed)
Colonoscopy due 02/2032

## 2023-10-06 NOTE — Assessment & Plan Note (Signed)
Hep C screen today

## 2023-10-06 NOTE — Assessment & Plan Note (Signed)
LFTs today Weight loss encouraged

## 2023-10-06 NOTE — Assessment & Plan Note (Signed)
CT 03/2023  Recommended follow up 18-24 months out if high risk (pt is low risk)

## 2023-10-08 LAB — HEPATITIS C ANTIBODY: Hepatitis C Ab: NONREACTIVE

## 2023-10-10 ENCOUNTER — Encounter: Payer: Self-pay | Admitting: Family Medicine

## 2023-10-10 DIAGNOSIS — E785 Hyperlipidemia, unspecified: Secondary | ICD-10-CM | POA: Insufficient documentation

## 2023-10-10 DIAGNOSIS — R7309 Other abnormal glucose: Secondary | ICD-10-CM | POA: Insufficient documentation

## 2023-12-30 ENCOUNTER — Ambulatory Visit: Payer: BC Managed Care – PPO | Admitting: Dermatology

## 2024-01-05 ENCOUNTER — Encounter: Payer: Self-pay | Admitting: Dermatology

## 2024-01-05 ENCOUNTER — Ambulatory Visit: Payer: BC Managed Care – PPO | Admitting: Dermatology

## 2024-01-05 DIAGNOSIS — D492 Neoplasm of unspecified behavior of bone, soft tissue, and skin: Secondary | ICD-10-CM | POA: Diagnosis not present

## 2024-01-05 DIAGNOSIS — L578 Other skin changes due to chronic exposure to nonionizing radiation: Secondary | ICD-10-CM | POA: Diagnosis not present

## 2024-01-05 DIAGNOSIS — B353 Tinea pedis: Secondary | ICD-10-CM

## 2024-01-05 DIAGNOSIS — Z79899 Other long term (current) drug therapy: Secondary | ICD-10-CM

## 2024-01-05 DIAGNOSIS — D225 Melanocytic nevi of trunk: Secondary | ICD-10-CM

## 2024-01-05 DIAGNOSIS — L918 Other hypertrophic disorders of the skin: Secondary | ICD-10-CM

## 2024-01-05 DIAGNOSIS — Z1283 Encounter for screening for malignant neoplasm of skin: Secondary | ICD-10-CM

## 2024-01-05 DIAGNOSIS — D1801 Hemangioma of skin and subcutaneous tissue: Secondary | ICD-10-CM

## 2024-01-05 DIAGNOSIS — Z86018 Personal history of other benign neoplasm: Secondary | ICD-10-CM

## 2024-01-05 DIAGNOSIS — D229 Melanocytic nevi, unspecified: Secondary | ICD-10-CM

## 2024-01-05 DIAGNOSIS — W908XXA Exposure to other nonionizing radiation, initial encounter: Secondary | ICD-10-CM

## 2024-01-05 DIAGNOSIS — Z7189 Other specified counseling: Secondary | ICD-10-CM

## 2024-01-05 DIAGNOSIS — L821 Other seborrheic keratosis: Secondary | ICD-10-CM

## 2024-01-05 DIAGNOSIS — L814 Other melanin hyperpigmentation: Secondary | ICD-10-CM

## 2024-01-05 DIAGNOSIS — L570 Actinic keratosis: Secondary | ICD-10-CM

## 2024-01-05 MED ORDER — TERBINAFINE HCL 250 MG PO TABS
250.0000 mg | ORAL_TABLET | Freq: Every day | ORAL | 0 refills | Status: DC
Start: 2024-01-05 — End: 2024-01-24

## 2024-01-05 NOTE — Patient Instructions (Addendum)
 Start Terbinafine 250 mg once daily for 1 month.    Terbinafine Counseling  Terbinafine is an anti-fungal medicine that can be applied to the skin (over the counter) or taken by mouth (prescription) to treat fungal infections. The pill version is often used to treat fungal infections of the nails or scalp. While most people do not have any side effects from taking terbinafine pills, some possible side effects of the medicine can include taste changes, headache, loss of smell, vision changes, nausea, vomiting, or diarrhea.   Rare side effects can include irritation of the liver, allergic reaction, or decrease in blood counts (which may show up as not feeling well or developing an infection). If you are concerned about any of these side effects, please stop the medicine and call your doctor, or in the case of an emergency such as feeling very unwell, seek immediate medical care.    Cryotherapy Aftercare  Wash gently with soap and water everyday.   Apply Vaseline Jelly daily until healed.     Wound Care Instructions  Cleanse wound gently with soap and water once a day then pat dry with clean gauze. Apply a thin coat of Petrolatum (petroleum jelly, "Vaseline") over the wound (unless you have an allergy to this). We recommend that you use a new, sterile tube of Vaseline. Do not pick or remove scabs. Do not remove the yellow or white "healing tissue" from the base of the wound.  Cover the wound with fresh, clean, nonstick gauze and secure with paper tape. You may use Band-Aids in place of gauze and tape if the wound is small enough, but would recommend trimming much of the tape off as there is often too much. Sometimes Band-Aids can irritate the skin.  You should call the office for your biopsy report after 1 week if you have not already been contacted.  If you experience any problems, such as abnormal amounts of bleeding, swelling, significant bruising, significant pain, or evidence of infection,  please call the office immediately.  FOR ADULT SURGERY PATIENTS: If you need something for pain relief you may take 1 extra strength Tylenol (acetaminophen) AND 2 Ibuprofen (200mg  each) together every 4 hours as needed for pain. (do not take these if you are allergic to them or if you have a reason you should not take them.) Typically, you may only need pain medication for 1 to 3 days.      Recommend daily broad spectrum sunscreen SPF 30+ to sun-exposed areas, reapply every 2 hours as needed. Call for new or changing lesions.  Staying in the shade or wearing long sleeves, sun glasses (UVA+UVB protection) and wide brim hats (4-inch brim around the entire circumference of the hat) are also recommended for sun protection.     Melanoma ABCDEs  Melanoma is the most dangerous type of skin cancer, and is the leading cause of death from skin disease.  You are more likely to develop melanoma if you: Have light-colored skin, light-colored eyes, or red or blond hair Spend a lot of time in the sun Tan regularly, either outdoors or in a tanning bed Have had blistering sunburns, especially during childhood Have a close family member who has had a melanoma Have atypical moles or large birthmarks  Early detection of melanoma is key since treatment is typically straightforward and cure rates are extremely high if we catch it early.   The first sign of melanoma is often a change in a mole or a new dark spot.  The ABCDE system is a way of remembering the signs of melanoma.  A for asymmetry:  The two halves do not match. B for border:  The edges of the growth are irregular. C for color:  A mixture of colors are present instead of an even brown color. D for diameter:  Melanomas are usually (but not always) greater than 6mm - the size of a pencil eraser. E for evolution:  The spot keeps changing in size, shape, and color.  Please check your skin once per month between visits. You can use a small mirror in  front and a large mirror behind you to keep an eye on the back side or your body.   If you see any new or changing lesions before your next follow-up, please call to schedule a visit.  Please continue daily skin protection including broad spectrum sunscreen SPF 30+ to sun-exposed areas, reapplying every 2 hours as needed when you're outdoors.   Staying in the shade or wearing long sleeves, sun glasses (UVA+UVB protection) and wide brim hats (4-inch brim around the entire circumference of the hat) are also recommended for sun protection.     Due to recent changes in healthcare laws, you may see results of your pathology and/or laboratory studies on MyChart before the doctors have had a chance to review them. We understand that in some cases there may be results that are confusing or concerning to you. Please understand that not all results are received at the same time and often the doctors may need to interpret multiple results in order to provide you with the best plan of care or course of treatment. Therefore, we ask that you please give [place] 2 business days to thoroughly review all your results before contacting the office for clarification. Should we see a critical lab result, you will be contacted sooner.   If You Need Anything After Your Visit  If you have any questions or concerns for your doctor, please call our main line at [phone] and press option 4 to reach your doctor's medical assistant. If no one answers, please leave a voicemail as directed and we will return your call as soon as possible. Messages left after 4 pm will be answered the following business day.   You may also send [place] a message via MyChart. We typically respond to MyChart messages within 1-2 business days.  For prescription refills, please ask your pharmacy to contact our office. Our fax number is [phone].  If you have an urgent issue when the clinic is closed that cannot wait until the next business day, you  can page your doctor at the number below.    Please note that while we do our best to be available for urgent issues outside of office hours, we are not available 24/7.   If you have an urgent issue and are unable to reach [place], you may choose to seek medical care at your doctor's office, retail clinic, urgent care center, or emergency room.  If you have a medical emergency, please immediately call 911 or go to the emergency department.  Pager Numbers  - Dr. Gwen Pounds: [phone]  - Dr. Roseanne Reno: [phone]  - Dr. Katrinka Blazing: [phone]   In the event of inclement weather, please call our main line at [phone] for an update on the status of any delays or closures.  Dermatology Medication Tips: Please keep the boxes that topical medications come in in order to help keep track of the instructions about where and  how to use these. Pharmacies typically print the medication instructions only on the boxes and not directly on the medication tubes.   If your medication is too expensive, please contact our office at [phone] option 4 or send [place] a message through MyChart.   We are unable to tell what your co-pay for medications will be in advance as this is different depending on your insurance coverage. However, we may be able to find a substitute medication at lower cost or fill out paperwork to get insurance to cover a needed medication.   If a prior authorization is required to get your medication covered by your insurance company, please allow [place] 1-2 business days to complete this process.  Drug prices often vary depending on where the prescription is filled and some pharmacies may offer cheaper prices.  The website www.goodrx.com contains coupons for medications through different pharmacies. The prices here do not account for what the cost may be with help from insurance (it may be cheaper with your insurance), but the website can give you the price if you did not use any insurance.  -  You can print the associated coupon and take it with your prescription to the pharmacy.  - You may also stop by our office during regular business hours and pick up a GoodRx coupon card.  - If you need your prescription sent electronically to a different pharmacy, notify our office through [facility] or by phone at [phone] option 4.     Si Usted Necesita Algo Despus de Su Visita  Tambin puede enviarnos un mensaje a travs de Clinical cytogeneticist. Por lo general respondemos a los mensajes de MyChart en el transcurso de 1 a 2 das hbiles.  Para renovar recetas, por favor pida a su farmacia que se ponga en contacto con nuestra oficina. Annie Sable de fax es [place] [phone].  Si tiene un asunto urgente cuando la clnica est cerrada y que no puede esperar hasta el siguiente da hbil, puede llamar/localizar a su doctor(a) al nmero que aparece a continuacin.   Por favor, tenga en cuenta que aunque hacemos todo lo posible para estar disponibles para asuntos urgentes fuera del horario de [place], no estamos disponibles las 24 horas del da, los 7 [address]  [address] de la [place].   Si tiene un problema urgente y no puede comunicarse con nosotros, puede optar por buscar atencin mdica  en el consultorio de su doctor(a), en una clnica privada, en un centro de atencin urgente o en una sala de emergencias.  Si tiene Engineer, drilling, por favor llame inmediatamente al 911 o vaya a la sala de emergencias.  Nmeros de bper  - Dr. Gwen Pounds: [phone]  - Dra. Roseanne Reno: [phone]  - Dr. Katrinka Blazing: [phone]   En caso de inclemencias del tiempo, por favor llame a Lacy Duverney principal al [phone] para una actualizacin sobre el [place] de cualquier retraso o cierre.  Consejos para la medicacin en dermatologa: Por favor, guarde las cajas en las que vienen los medicamentos de uso tpico para ayudarle a seguir las instrucciones sobre dnde y cmo usarlos. Las farmacias generalmente imprimen  las instrucciones del medicamento slo en las cajas y no directamente en los tubos del [place].   Si su medicamento es muy caro, por favor, pngase en contacto con Rolm Gala llamando al [phone] y presione la opcin 4 o envenos un mensaje a travs de Clinical cytogeneticist.   No podemos decirle cul ser su copago por los medicamentos por adelantado ya que esto es  diferente dependiendo de la cobertura de su seguro. Sin embargo, es posible que podamos encontrar un medicamento sustituto a Audiological scientist un formulario para que el seguro cubra el medicamento que se considera necesario.   Si se requiere una autorizacin previa para que su compaa de seguros [place] su medicamento, por favor permtanos de 1 a 2 das hbiles para completar [address]  Los precios de los medicamentos varan con frecuencia dependiendo del Environmental consultant de dnde se surte la receta y alguna farmacias pueden ofrecer precios ms baratos.  El sitio web www.goodrx.com tiene cupones para medicamentos de Health and safety inspector. Los precios aqu no tienen en cuenta lo que podra costar con la ayuda del seguro (puede ser ms barato con su seguro), pero el sitio web puede darle el precio si no utiliz Tourist information centre manager.  - Puede imprimir el cupn correspondiente y llevarlo con su receta a la farmacia.  - Tambin puede pasar por nuestra oficina durante el horario de atencin regular y Education officer, museum una tarjeta de cupones de GoodRx.  - Si necesita que su receta se enve electrnicamente a una farmacia diferente, informe a nuestra oficina a travs de MyChart de [place] o por telfono llamando al [phone] y presione la opcin 4.

## 2024-01-05 NOTE — Progress Notes (Signed)
 Follow-Up Visit   Subjective  Raymond West is a 48 y.o. male who presents for the following: Skin Cancer Screening and Full Body Skin Exam. Hx of dysplastic nevus with severe atypia.   The patient presents for Total-Body Skin Exam (TBSE) for skin cancer screening and mole check. The patient has spots, moles and lesions to be evaluated, some may be new or changing and the patient may have concern these could be cancer.  The following portions of the chart were reviewed this encounter and updated as appropriate: medications, allergies, medical history  Review of Systems:  No other skin or systemic complaints except as noted in HPI or Assessment and Plan.  Objective  Well appearing patient in no apparent distress; mood and affect are within normal limits.  A full examination was performed including scalp, head, eyes, ears, nose, lips, neck, chest, axillae, abdomen, back, buttocks, bilateral upper extremities, bilateral lower extremities, hands, feet, fingers, toes, fingernails, and toenails. All findings within normal limits unless otherwise noted below.   Relevant physical exam findings are noted in the Assessment and Plan.  Right Sacral area 0.3 cm dark brown macule  scalp x2, Left ear x1, L cheek x1 (4) Erythematous thin papules/macules with gritty scale.   Assessment & Plan   SKIN CANCER SCREENING PERFORMED TODAY.  ACTINIC DAMAGE - Chronic condition, secondary to cumulative UV/sun exposure - diffuse scaly erythematous macules with underlying dyspigmentation - Recommend daily broad spectrum sunscreen SPF 30+ to sun-exposed areas, reapply every 2 hours as needed.  - Staying in the shade or wearing long sleeves, sun glasses (UVA+UVB protection) and wide brim hats (4-inch brim around the entire circumference of the hat) are also recommended for sun protection.  - Call for new or changing lesions.  LENTIGINES, SEBORRHEIC KERATOSES, HEMANGIOMAS - Benign normal skin lesions -  Benign-appearing - Call for any changes  History of Dysplastic Nevi - No evidence of recurrence today - Recommend regular full body skin exams - Recommend daily broad spectrum sunscreen SPF 30+ to sun-exposed areas, reapply every 2 hours as needed.  - Call if any new or changing lesions are noted between office visits  MELANOCYTIC NEVI - Tan-brown and/or pink-flesh-colored symmetric macules and papules - Benign appearing on exam today - Observation - Call clinic for new or changing moles - Recommend daily use of broad spectrum spf 30+ sunscreen to sun-exposed areas.   Acrochordons (Skin Tags) - Fleshy, skin-colored pedunculated papules - Benign appearing.  - Observe. - If desired, they can be removed with an in office procedure that is not covered by insurance. - Please call the clinic if you notice any new or changing lesions.   TINEA PEDIS Exam: Scaling and peeling at B/L plantar feet. Chronic and persistent condition with duration or expected duration over one year. Condition is symptomatic / bothersome to patient. Not to goal. Previous topical Ketoconazole helped but did not clear Treatment Plan: Start Terbinafine 250 mg once daily for 1 month.   Reviewed LFT's from 10/06/2023 WNL.  Terbinafine Counseling Terbinafine is an anti-fungal medicine that can be applied to the skin (over the counter) or taken by mouth (prescription) to treat fungal infections. The pill version is often used to treat fungal infections of the nails or scalp. While most people do not have any side effects from taking terbinafine pills, some possible side effects of the medicine can include taste changes, headache, loss of smell, vision changes, nausea, vomiting, or diarrhea.   Rare side effects can include irritation of  the liver, allergic reaction, or decrease in blood counts (which may show up as not feeling well or developing an infection). If you are concerned about any of these side effects, please  stop the medicine and call your doctor, or in the case of an emergency such as feeling very unwell, seek immediate medical care.     NEOPLASM OF SKIN Right Sacral area Epidermal / dermal shaving  Lesion diameter (cm):  0.3 Informed consent: discussed and consent obtained   Timeout: patient name, date of birth, surgical site, and procedure verified   Procedure prep:  Patient was prepped and draped in usual sterile fashion Prep type:  Isopropyl alcohol Anesthesia: the lesion was anesthetized in a standard fashion   Anesthetic:  1% lidocaine w/ epinephrine 1-100,000 buffered w/ 8.4% NaHCO3 Instrument used: flexible razor blade   Hemostasis achieved with: pressure, aluminum chloride and electrodesiccation   Outcome: patient tolerated procedure well   Post-procedure details: sterile dressing applied and wound care instructions given   Dressing type: bandage and petrolatum   Specimen 1 - Surgical pathology Differential Diagnosis: R/O dysplastic nevus  Check Margins: No AK (ACTINIC KERATOSIS) (4) scalp x2, Left ear x1, L cheek x1 (4) Actinic keratoses are precancerous spots that appear secondary to cumulative UV radiation exposure/sun exposure over time. They are chronic with expected duration over 1 year. A portion of actinic keratoses will progress to squamous cell carcinoma of the skin. It is not possible to reliably predict which spots will progress to skin cancer and so treatment is recommended to prevent development of skin cancer.  Recommend daily broad spectrum sunscreen SPF 30+ to sun-exposed areas, reapply every 2 hours as needed.  Recommend staying in the shade or wearing long sleeves, sun glasses (UVA+UVB protection) and wide brim hats (4-inch brim around the entire circumference of the hat). Call for new or changing lesions. Destruction of lesion - scalp x2, Left ear x1, L cheek x1 (4) Complexity: simple   Destruction method: cryotherapy   Informed consent: discussed and consent  obtained   Timeout:  patient name, date of birth, surgical site, and procedure verified Lesion destroyed using liquid nitrogen: Yes   Region frozen until ice ball extended beyond lesion: Yes   Outcome: patient tolerated procedure well with no complications   Post-procedure details: wound care instructions given   Additional details:  Prior to procedure, discussed risks of blister formation, small wound, skin dyspigmentation, or rare scar following cryotherapy. Recommend Vaseline ointment to treated areas while healing.  SKIN CANCER SCREENING   ACTINIC SKIN DAMAGE   MEDICATION MANAGEMENT   COUNSELING AND COORDINATION OF CARE   LENTIGO   MELANOCYTIC NEVUS, UNSPECIFIED LOCATION   HEMANGIOMA OF SKIN   HISTORY OF DYSPLASTIC NEVUS    Return in about 1 year (around 01/04/2025) for TBSE, HxDN.  I, Lawson Radar, CMA, am acting as scribe for Armida Sans, MD.   Documentation: I have reviewed the above documentation for accuracy and completeness, and I agree with the above.  Armida Sans, MD

## 2024-01-07 LAB — SURGICAL PATHOLOGY

## 2024-01-10 ENCOUNTER — Encounter: Payer: Self-pay | Admitting: Dermatology

## 2024-01-10 ENCOUNTER — Telehealth: Payer: Self-pay

## 2024-01-10 NOTE — Telephone Encounter (Signed)
-----   Message from Armida Sans sent at 01/10/2024  3:54 PM EST ----- FINAL DIAGNOSIS        1. Skin, right sacral area :       DYSPLASTIC COMPOUND NEVUS WITH MODERATE ATYPIA, LIMITED MARGINS FREE   Moderate dysplastic Recheck next visit

## 2024-01-10 NOTE — Telephone Encounter (Signed)
 Patient informed of pathology results

## 2024-01-24 ENCOUNTER — Ambulatory Visit (INDEPENDENT_AMBULATORY_CARE_PROVIDER_SITE_OTHER): Admitting: General Practice

## 2024-01-24 ENCOUNTER — Encounter: Payer: Self-pay | Admitting: Family Medicine

## 2024-01-24 ENCOUNTER — Encounter: Payer: Self-pay | Admitting: General Practice

## 2024-01-24 ENCOUNTER — Ambulatory Visit: Payer: Self-pay | Admitting: Family Medicine

## 2024-01-24 VITALS — BP 122/80 | HR 71 | Temp 97.6°F | Wt 274.0 lb

## 2024-01-24 DIAGNOSIS — R21 Rash and other nonspecific skin eruption: Secondary | ICD-10-CM

## 2024-01-24 DIAGNOSIS — T7840XA Allergy, unspecified, initial encounter: Secondary | ICD-10-CM | POA: Insufficient documentation

## 2024-01-24 MED ORDER — HYDROXYZINE PAMOATE 25 MG PO CAPS: 25.0000 mg | ORAL_CAPSULE | Freq: Three times a day (TID) | ORAL | 0 refills | Status: DC | PRN

## 2024-01-24 MED ORDER — PREDNISONE 10 MG (21) PO TBPK: ORAL_TABLET | ORAL | 0 refills | Status: DC

## 2024-01-24 NOTE — Progress Notes (Signed)
 Established Patient Office Visit  Subjective   Patient ID: Raymond West, male    DOB: [birth year]  Age: 48 y.o. MRN: [id number]  Chief Complaint  Patient presents with   Rash    On Both arms, groin and down legs since Saturday evening. Patient has applied cortisone cream and took benadryl today. Rash is itchy. Patient was placed on lamisil about 3 weeks ago.     Rash Pertinent negatives include no diarrhea, fever, shortness of breath or vomiting.    Raymond West is a 48 year old male, patient of Dr. Milinda Antis, with past medical history of HTN, lung nodules, fatty liver, HLD presents today for an acute visit to discuss rash.   Rash: onset Saturday in the groin bilaterally, forearms and legs. Only hives. Itchy no pain. He has was started on Lamisil about 3 weeks ago once daily for athlete's foot by his dermatologist. No recent change in detergent, lotions. He denies any other symptoms. He has not contact the dermatologist but does report that the athlete's foot is chronic and is currently stable. He denies any chest pain, shortness of breath or difficulty breathing.  Patient Active Problem List   Diagnosis Date Noted   Rash due to allergy 01/24/2024   Elevated glucose level 10/10/2023   Hyperlipidemia 10/10/2023   Encounter for hepatitis C screening test for low risk patient 10/06/2023   Fatty liver 03/17/2022   Lung nodules 03/17/2022   Colon cancer screening 09/22/2021   H/O: pneumonia 03/18/2018   Class 2 severe obesity due to excess calories with serious comorbidity and body mass index (BMI) of 38.0 to 38.9 in adult [facility]) 06/26/2016   Athlete's foot 06/08/2014   Routine general medical examination at a health care facility 05/27/2012   Essential hypertension 01/15/2010   Past Medical History:  Diagnosis Date   Dysplastic nevus 04/02/2010   right upper back, 3.0 cm lat to spine, moderate to severe atypia    Dysplastic nevus 01/05/2024   R sacral area, moderate   Neoplasm of  uncertain behavior of skin    Unspecified essential hypertension    Past Surgical History:  Procedure Laterality Date   NO PAST SURGERIES     Family History  Problem Relation Age of Onset   Leukemia Mother    Diabetes Mother    Kidney disease Father        has been on dialysis for years   Diabetes Father    Hypertension Father    Neuropathy Father    Gout Father    Arthritis Maternal Grandmother    Arthritis Maternal Grandfather    Arthritis Paternal Grandmother    Arthritis Paternal Grandfather    Heart attack Paternal Grandfather        smoker   Colon cancer Neg Hx    Esophageal cancer Neg Hx          01/24/2024   11:31 AM 10/06/2023    8:32 AM 09/23/2022    9:36 AM  Depression screen PHQ 2/9  Decreased Interest 0 0 0  Down, Depressed, Hopeless 0 0 0  PHQ - 2 Score 0 0 0  Altered sleeping 0 0   Tired, decreased energy 0 0   Change in appetite 0 0   Feeling bad or failure about yourself  0 0   Trouble concentrating 0 0   Moving slowly or fidgety/restless 0 0   Suicidal thoughts 0 0   PHQ-9 Score 0 0   Difficult doing work/chores Not difficult  at all Not difficult at all        01/24/2024   11:31 AM 10/06/2023    8:33 AM  GAD 7 : Generalized Anxiety Score  Nervous, Anxious, on Edge 0 0  Control/stop worrying 0 0  Worry too much - different things 0 0  Trouble relaxing 0 0  Restless 0 0  Easily annoyed or irritable 0 0  Afraid - awful might happen 0 0  Total GAD 7 Score 0 0  Anxiety Difficulty Not difficult at all Not difficult at all      Review of Systems  Constitutional:  Negative for chills and fever.  Respiratory:  Negative for shortness of breath.   Cardiovascular:  Negative for chest pain.  Gastrointestinal:  Negative for abdominal pain, constipation, diarrhea, heartburn, nausea and vomiting.  Genitourinary:  Negative for dysuria, frequency and urgency.  Skin:  Positive for itching and rash.  Neurological:  Negative for dizziness and  headaches.  Endo/Heme/Allergies:  Negative for polydipsia.  Psychiatric/Behavioral:  Negative for depression and suicidal ideas. The patient is not nervous/anxious.       Objective:     BP 122/80 (BP Location: Left Arm, Patient Position: Sitting, Cuff Size: Normal)   Pulse 71   Temp 97.6 F (36.4 C) (Oral)   Wt 274 lb (124.3 kg)   SpO2 96%   BMI 39.04 kg/m  BP Readings from Last 3 Encounters:  01/24/24 122/80  10/06/23 120/80  07/05/23 100/60   Wt Readings from Last 3 Encounters:  01/24/24 274 lb (124.3 kg)  10/06/23 270 lb 6 oz (122.6 kg)  07/05/23 272 lb 2 oz (123.4 kg)      Physical Exam Vitals and nursing note reviewed.  Constitutional:      Appearance: Normal appearance.  Cardiovascular:     Rate and Rhythm: Normal rate and regular rhythm.     Pulses: Normal pulses.     Heart sounds: Normal heart sounds.  Pulmonary:     Effort: Pulmonary effort is normal.     Breath sounds: Normal breath sounds.  Skin:    Findings: Rash present.          Comments: With chaperone present, hives noted on physical exam on bilateral posterior thighs and forearms.  Neurological:     Mental Status: He is alert and oriented to person, place, and time.  Psychiatric:        Mood and Affect: Mood normal.        Behavior: Behavior normal.        Thought Content: Thought content normal.        Judgment: Judgment normal.      No results found for any visits on 01/24/24.     The 10-year ASCVD risk score (Arnett DK, et al., 2019) is: 3.7%    Assessment & Plan:  Rash due to allergy Assessment & Plan: Clinical presentation and symptoms suggestive of Urticaria.   Suspect this could be an allergic reaction secondary to Lamisil.   Discussed with patient to stop the Lamisil and contact dermatologist.   Rx sent for prednisone taper and hydroxyzine. Discussed side effects.   Discussed ER precautions.   He will update if symptoms worsen or do not improve.   Orders: -      predniSONE; Follow instructions on the package.  Dispense: 21 tablet; Refill: 0 -     hydrOXYzine Pamoate; Take 1 capsule (25 mg total) by mouth every 8 (eight) hours as needed.  Dispense: 30 capsule; Refill:  0     Return if symptoms worsen or fail to improve.    Modesto Charon, NP

## 2024-01-24 NOTE — Assessment & Plan Note (Signed)
 Clinical presentation and symptoms suggestive of Urticaria.   Suspect this could be an allergic reaction secondary to Lamisil.   Discussed with patient to stop the Lamisil and contact dermatologist.   Rx sent for prednisone taper and hydroxyzine. Discussed side effects.   Discussed ER precautions.   He will update if symptoms worsen or do not improve.

## 2024-01-24 NOTE — Patient Instructions (Signed)
 Start prednisone taper pack. Finish all of it.   Contact dermatology office and let them know about the reaction.   Rx sent for hydroxyzine to help with itching.   Follow up with PCP if symptoms worsen or do not improve.   As we discussed, please go to the ER if you develop more severe symptoms.   It was a pleasure meeting you!

## 2024-01-24 NOTE — Telephone Encounter (Signed)
  Chief Complaint: hives Symptoms: raised, red bumps (estimates 50-100) on bilateral arms/legs/waist  Frequency: started Saturday, worsened yesterday Pertinent Negatives: Patient denies fluid filled or blisters, difficulty breathing, facial or tongue swelling, fever, pain. Disposition: [] ED /[] Urgent Care (no appt availability in office) / [] Appointment(In office/virtual)/ []  [place] [facility]/ [] Home Care/ [] Refused Recommended Disposition /[] [place] [facility]/ [x]  Follow-up with PCP Additional Notes: Patient states he took benadryl and used cortisone cream yesterday. He states itching it still severe. Patient thinks this is related to a new medication, lamisil. Offered patient appt today with his PCP, he refused and is asking for anything earlier in the day. Patient scheduled for first available appt with PCP clinic.  Copied from CRM [id number]. Topic: Clinical - Red Word Triage >> Jan 24, 2024  7:34 AM Fonda Kinder J wrote: Red Word that prompted transfer to Nurse Triage: Allergic reaction. Pt is breaking out in hives on his legs,waist and arms, he was prescribed terbinafine (LAMISIL) 250 MG and believes it is coming from the medication. His allergic reaction started last night Reason for Disposition  [1] MODERATE-SEVERE hives persist (i.e., hives interfere with normal activities or work) AND [2] taking antihistamine (e.g., Benadryl, Claritin) > 24 hours  Answer Assessment - Initial Assessment Questions 1. APPEARANCE: "What does the rash look like?"      Raised, pink and red.  2. LOCATION: "Where is the rash located?"      Waist, both arms and legs.  3. NUMBER: "How many hives are there?"      50-100.  4. SIZE: "How big are the hives?" (inches, cm, compare to coins) "Do they all look the same or is there lots of variation in shape and size?"      Size of quarter to a silver dollar.  5. ONSET: "When did the hives begin?" (Hours or days ago)      Saturday night he states he saw one  spot, yesterday morning woke up with itching and hives had spread.  6. ITCHING: "Does it itch?" If Yes, ask: "How bad is the itch?"    - MILD: doesn't interfere with normal activities   - MODERATE-SEVERE: interferes with work, school, sleep, or other activities      Severe.  7. RECURRENT PROBLEM: "Have you had hives before?" If Yes, ask: "When was the last time?" and "What happened that time?"      Yes, he states as a reaction to medication before. Last time he states he just came off the medication but the hives weren't as severe.  8. TRIGGERS: "Were you exposed to any new food, plant, cosmetic product or animal just before the hives began?"     Patient states he started terbinafine 3 weeks ago and thinks it could be related. Denies any new foods or products.  9. OTHER SYMPTOMS: "Do you have any other symptoms?" (e.g., fever, tongue swelling, difficulty breathing, abdomen pain)     Denies.  10. PREGNANCY: "Is there any chance you are pregnant?" "When was your last menstrual period?"       N/A.  Protocols used: Hives-A-AH

## 2024-02-25 ENCOUNTER — Ambulatory Visit
Admission: EM | Admit: 2024-02-25 | Discharge: 2024-02-25 | Disposition: A | Discharge status: Home / Self Care | Attending: Emergency Medicine | Admitting: Emergency Medicine

## 2024-02-25 ENCOUNTER — Ambulatory Visit (INDEPENDENT_AMBULATORY_CARE_PROVIDER_SITE_OTHER)

## 2024-02-25 DIAGNOSIS — M25561 Pain in right knee: Secondary | ICD-10-CM

## 2024-02-25 DIAGNOSIS — M76891 Other specified enthesopathies of right lower limb, excluding foot: Secondary | ICD-10-CM

## 2024-02-25 NOTE — Discharge Instructions (Addendum)
 The xray of your knee is pending.    Take ibuprofen  as directed.  Rest and elevate your knee.  Apply ice packs 2-3 times a day for up to 20 minutes each.     Follow up with an orthopedist.    Go to the emergency department if you have worsening symptoms.

## 2024-02-25 NOTE — ED Triage Notes (Addendum)
 Patient to Urgent Care with complaints of right sided knee pain. Reports burning pain radiates from knee into shin.   Symptoms started 9 days ago. Reports he squatted onto a piece of metal.  Using ice at night.

## 2024-02-25 NOTE — ED Provider Notes (Signed)
 [facility]    CSN: [id number] Arrival date & time: 02/25/24  0846      History   Chief Complaint Chief Complaint  Patient presents with   Knee Pain    HPI Raymond West is a 48 y.o. male.  Patient presents with 9-day history of right knee pain and swelling which is worse at the end of the day.  He describes the pain as burning from his knee to his shin.  He states he has redness in his knee at the end of the day which is not present currently.  His symptoms started when he accidentally knelt down and put his knee on a piece of metal.  The metal did not cut his skin.  He denies wounds, fever, chills, numbness, weakness.  He has been treating his symptoms with ice packs in the evenings.  The history is provided by the patient and medical records.    Past Medical History:  Diagnosis Date   Dysplastic nevus 04/02/2010   right upper back, 3.0 cm lat to spine, moderate to severe atypia    Dysplastic nevus 01/05/2024   R sacral area, moderate   Neoplasm of uncertain behavior of skin    Unspecified essential hypertension     Patient Active Problem List   Diagnosis Date Noted   Rash due to allergy 01/24/2024   Elevated glucose level 10/10/2023   Hyperlipidemia 10/10/2023   Encounter for hepatitis C screening test for low risk patient 10/06/2023   Fatty liver 03/17/2022   Lung nodules 03/17/2022   Colon cancer screening 09/22/2021   H/O: pneumonia 03/18/2018   Class 2 severe obesity due to excess calories with serious comorbidity and body mass index (BMI) of 38.0 to 38.9 in adult [facility]) 06/26/2016   Athlete's foot 06/08/2014   Routine general medical examination at a health care facility 05/27/2012   Essential hypertension 01/15/2010    Past Surgical History:  Procedure Laterality Date   NO PAST SURGERIES         Home Medications    Prior to Admission medications   Medication Sig Start Date End Date Taking? Authorizing Provider  amLODipine  (NORVASC ) 5 MG  tablet Take 1 tablet (5 mg total) by mouth daily. 10/06/23   Tower, Laine LABOR, MD  hydrOXYzine  (VISTARIL ) 25 MG capsule Take 1 capsule (25 mg total) by mouth every 8 (eight) hours as needed. 01/24/24   Vincente Shivers, NP  ketoconazole  (NIZORAL ) 2 % cream Apply to feet and in between toes nightly. 10/06/23   Tower, Laine LABOR, MD  losartan  (COZAAR ) 100 MG tablet Take 0.5 tablets (50 mg total) by mouth daily. 10/06/23   Tower, Laine LABOR, MD  predniSONE  (STERAPRED UNI-PAK 21 TAB) 10 MG (21) TBPK tablet Follow instructions on the package. Patient not taking: Reported on 02/25/2024 01/24/24   Vincente Shivers, NP    Family History Family History  Problem Relation Age of Onset   Leukemia Mother    Diabetes Mother    Kidney disease Father        has been on dialysis for years   Diabetes Father    Hypertension Father    Neuropathy Father    Gout Father    Arthritis Maternal Grandmother    Arthritis Maternal Grandfather    Arthritis Paternal Grandmother    Arthritis Paternal Grandfather    Heart attack Paternal Grandfather        smoker   Colon cancer Neg Hx    Esophageal cancer Neg Hx  Social History Social History   Tobacco Use   Smoking status: Never   Smokeless tobacco: Never  Vaping Use   Vaping status: Never Used  Substance Use Topics   Alcohol use: Yes    Alcohol/week: 0.0 standard drinks of alcohol    Comment: rarely   Drug use: No     Allergies   Amoxicillin, Benazepril, Keflex  Dacey.crafts ], Lamisil  [terbinafine ], and Penicillins   Review of Systems Review of Systems  Constitutional:  Negative for chills and fever.  Musculoskeletal:  Positive for arthralgias and joint swelling. Negative for gait problem.  Skin:  Positive for color change. Negative for wound.  Neurological:  Negative for weakness and numbness.     Physical Exam Triage Vital Signs ED Triage Vitals  Encounter Vitals Group     BP      Systolic BP Percentile      Diastolic BP Percentile      Pulse       Resp      Temp      Temp src      SpO2      Weight      Height      Head Circumference      Peak Flow      Pain Score      Pain Loc      Pain Education      Exclude from Growth Chart    No data found.  Updated Vital Signs BP 128/87   Pulse 83   Temp 98 F (36.7 C)   Resp 18   SpO2 97%   Visual Acuity Right Eye Distance:   Left Eye Distance:   Bilateral Distance:    Right Eye Near:   Left Eye Near:    Bilateral Near:     Physical Exam Constitutional:      General: He is not in acute distress. HENT:     Mouth/Throat:     Mouth: Mucous membranes are moist.  Cardiovascular:     Rate and Rhythm: Normal rate and regular rhythm.  Pulmonary:     Effort: Pulmonary effort is normal. No respiratory distress.  Musculoskeletal:        General: Swelling and tenderness present. No deformity. Normal range of motion.  Skin:    General: Skin is warm and dry.     Capillary Refill: Capillary refill takes less than 2 seconds.     Findings: Bruising present. No erythema or lesion.  Neurological:     General: No focal deficit present.     Mental Status: He is alert and oriented to person, place, and time.     Sensory: No sensory deficit.     Motor: No weakness.     Gait: Gait normal.      UC Treatments / Results  Labs (all labs ordered are listed, but only abnormal results are displayed) Labs Reviewed - No data to display  EKG   Radiology DG Knee Complete 4 Views Right Result Date: 02/25/2024 CLINICAL DATA:  Right knee pain EXAM: RIGHT KNEE - COMPLETE 4+ VIEW COMPARISON:  None available FINDINGS: No fracture or dislocation. Mild thickening of the patellar tendon suspicious for tendonitis. IMPRESSION: 1. No fracture or dislocation. 2. Mild thickening of the patellar tendon, suspicious for tendonitis. Electronically Signed   By: Aliene Lloyd M.D.   On: 02/25/2024 10:22    Procedures Procedures (including critical care time)  Medications Ordered in UC Medications -  No data to display  Initial Impression /  Assessment and Plan / UC Course  I have reviewed the triage vital signs and the nursing notes.  Pertinent labs & imaging results that were available during my care of the patient were reviewed by me and considered in my medical decision making (see chart for details).    Right knee pain, tendonitis of knee.  Xray shows 1. No fracture or dislocation. 2. Mild thickening of the patellar tendon, suspicious for tendonitis.  Patient declines knee sleeve.  Discussed ibuprofen , rest elevation, ice packs.  Instructed him to follow up with an orthopedist; he prefers [facility].  ED precautions discussed. He agrees to plan of care.   Final Clinical Impressions(s) / UC Diagnoses   Final diagnoses:  Acute pain of right knee  Tendonitis of knee, right     Discharge Instructions      The xray of your knee is pending.    Take ibuprofen  as directed.  Rest and elevate your knee.  Apply ice packs 2-3 times a day for up to 20 minutes each.     Follow up with an orthopedist.    Go to the emergency department if you have worsening symptoms.         ED Prescriptions   None    PDMP not reviewed this encounter.   Corlis Burnard DEL, NP 02/25/24 1039

## 2024-02-28 ENCOUNTER — Ambulatory Visit (HOSPITAL_COMMUNITY)
Admission: RE | Admit: 2024-02-28 | Discharge: 2024-02-28 | Disposition: A | Discharge status: Home / Self Care | Source: Ambulatory Visit | Attending: Surgery | Admitting: Surgery

## 2024-02-28 ENCOUNTER — Other Ambulatory Visit (HOSPITAL_COMMUNITY): Payer: Self-pay | Admitting: Medical Oncology

## 2024-02-28 DIAGNOSIS — M7989 Other specified soft tissue disorders: Secondary | ICD-10-CM | POA: Insufficient documentation

## 2024-02-28 DIAGNOSIS — M7651 Patellar tendinitis, right knee: Secondary | ICD-10-CM | POA: Diagnosis not present

## 2024-02-28 DIAGNOSIS — M79604 Pain in right leg: Secondary | ICD-10-CM | POA: Diagnosis not present

## 2024-02-28 DIAGNOSIS — M7041 Prepatellar bursitis, right knee: Secondary | ICD-10-CM | POA: Diagnosis not present

## 2024-03-31 DIAGNOSIS — M7041 Prepatellar bursitis, right knee: Secondary | ICD-10-CM | POA: Diagnosis not present

## 2024-05-29 ENCOUNTER — Encounter: Payer: Self-pay | Admitting: Emergency Medicine

## 2024-05-29 ENCOUNTER — Ambulatory Visit: Admitting: Emergency Medicine

## 2024-05-29 ENCOUNTER — Ambulatory Visit: Admitting: Internal Medicine

## 2024-05-29 VITALS — BP 138/84 | HR 83 | Temp 98.2°F | Resp 16 | Ht 70.0 in | Wt 274.0 lb

## 2024-05-29 DIAGNOSIS — S40261A Insect bite (nonvenomous) of right shoulder, initial encounter: Secondary | ICD-10-CM | POA: Diagnosis not present

## 2024-05-29 DIAGNOSIS — W57XXXA Bitten or stung by nonvenomous insect and other nonvenomous arthropods, initial encounter: Secondary | ICD-10-CM | POA: Diagnosis not present

## 2024-05-29 DIAGNOSIS — L089 Local infection of the skin and subcutaneous tissue, unspecified: Secondary | ICD-10-CM

## 2024-05-29 MED ORDER — DOXYCYCLINE MONOHYDRATE 100 MG PO CAPS: 100.0000 mg | ORAL_CAPSULE | Freq: Two times a day (BID) | ORAL | 0 refills | Status: AC

## 2024-05-29 NOTE — Patient Instructions (Signed)
 Antibiotic twice a day as prescribed, for 1 week Wear sun protective clothing while taking the medication as discussed Recheck in 72 hours if no better, sooner if you are getting worse

## 2024-05-29 NOTE — Progress Notes (Signed)
 Subjective:  Insect Bite (Insect bite on right shoulder x 7 days. Fatigue, muscle cramps and swelling x 4 days. Denies itching. Area is hot to touch, red and swollen. )    HPI: Raymond West is a 48 y.o. male presenting on 05/29/2024 with report of suspected infection to an insect bite to the back of the right shoulder.  1 week ago, felt like something might have been biting him so he used a screwdriver to scratch it away and felt a pop.  3 days later, started to develop fatigue and bodyaches and has noticed more swelling and soreness to the site of the bite.  Denies any measured fevers.  Denies any joint swelling, severe headaches, vomiting. Last tetanus 10/2019   ROS: Negative unless specifically indicated above in HPI.   Relevant past medical history reviewed and updated as indicated.   Allergies and medications reviewed and updated.   Current Outpatient Medications:    amLODipine  (NORVASC ) 5 MG tablet, Take 1 tablet (5 mg total) by mouth daily., Disp: 90 tablet, Rfl: 3   losartan  (COZAAR ) 100 MG tablet, Take 0.5 tablets (50 mg total) by mouth daily., Disp: 45 tablet, Rfl: 3   hydrOXYzine  (VISTARIL ) 25 MG capsule, Take 1 capsule (25 mg total) by mouth every 8 (eight) hours as needed. (Patient not taking: Reported on 05/29/2024), Disp: 30 capsule, Rfl: 0   ketoconazole  (NIZORAL ) 2 % cream, Apply to feet and in between toes nightly. (Patient not taking: Reported on 05/29/2024), Disp: 60 g, Rfl: 11   predniSONE  (STERAPRED UNI-PAK 21 TAB) 10 MG (21) TBPK tablet, Follow instructions on the package. (Patient not taking: Reported on 02/25/2024), Disp: 21 tablet, Rfl: 0  Allergies  Allergen Reactions   Amoxicillin     REACTION: rash   Benazepril Cough   Keflex  [Cephalexin ]     Rash    Lamisil  [Terbinafine ]    Penicillins Rash    Objective:   BP 138/84   Pulse 83   Temp 98.2 F (36.8 C)   Resp 16   Ht 5' 10 (1.778 m)   Wt 274 lb (124.3 kg)   SpO2 96%   BMI 39.31 kg/m     Physical Exam Vital signs reviewed Appears well, in no acute distress Heart with regular rate and rhythm Normal respiratory effort To the posterior right shoulder there is a 1 cm erythematous nodular lesion with a central scab with scattered erythematous papules extending 1.5 cm lateral and inferior.  No areas of fluctuance.   Assessment & Plan:  1. Skin infection (Primary) Suspect is a complication from a small open wound related to the insect bite.  Does not look to have a drainable abscess.  No vesicular lesions to suggest shingles.  He is afebrile without tachycardia. - doxycycline  (MONODOX ) 100 MG capsule; Take 1 capsule (100 mg total) by mouth 2 (two) times daily for 7 days. Take with full glass of water. Avoid excessive sunlight while taking  Dispense: 14 capsule; Refill: 0  2. Insect bite of right shoulder, initial encounter See above.  We discussed potential for tick but he does not believe he could have had something attached to this area for more than 24 hours without his knowledge and the rash that he presents with today does not look consistent with erythema migrans.  Patient Instructions  Antibiotic twice a day as prescribed, for 1 week Wear sun protective clothing while taking the medication as discussed Recheck in 72 hours if no better, sooner if you are  getting worse        Follow up plan: Return if symptoms worsen or fail to improve.  Corean Geralds, MSPAS, PA-C

## 2024-07-17 DIAGNOSIS — M7582 Other shoulder lesions, left shoulder: Secondary | ICD-10-CM | POA: Diagnosis not present

## 2024-07-17 DIAGNOSIS — M19012 Primary osteoarthritis, left shoulder: Secondary | ICD-10-CM | POA: Diagnosis not present

## 2024-07-24 DIAGNOSIS — M25512 Pain in left shoulder: Secondary | ICD-10-CM | POA: Diagnosis not present

## 2024-08-07 DIAGNOSIS — M25512 Pain in left shoulder: Secondary | ICD-10-CM | POA: Diagnosis not present

## 2024-08-18 ENCOUNTER — Telehealth: Payer: Self-pay | Admitting: *Deleted

## 2024-08-18 NOTE — Telephone Encounter (Signed)
 PCP received surgical clearance from [facility], per Dr. Randeen pt needs surgical clearance, please schedule, thanks

## 2024-08-25 ENCOUNTER — Encounter: Payer: Self-pay | Admitting: Family Medicine

## 2024-08-25 ENCOUNTER — Ambulatory Visit (INDEPENDENT_AMBULATORY_CARE_PROVIDER_SITE_OTHER): Admitting: Family Medicine

## 2024-08-25 VITALS — BP 118/71 | HR 58 | Temp 97.9°F | Ht 70.0 in | Wt 269.1 lb

## 2024-08-25 DIAGNOSIS — E66812 Obesity, class 2: Secondary | ICD-10-CM | POA: Diagnosis not present

## 2024-08-25 DIAGNOSIS — Z23 Encounter for immunization: Secondary | ICD-10-CM | POA: Diagnosis not present

## 2024-08-25 DIAGNOSIS — K760 Fatty (change of) liver, not elsewhere classified: Secondary | ICD-10-CM | POA: Diagnosis not present

## 2024-08-25 DIAGNOSIS — Z01810 Encounter for preprocedural cardiovascular examination: Secondary | ICD-10-CM | POA: Diagnosis not present

## 2024-08-25 DIAGNOSIS — Z6838 Body mass index (BMI) 38.0-38.9, adult: Secondary | ICD-10-CM

## 2024-08-25 DIAGNOSIS — I10 Essential (primary) hypertension: Secondary | ICD-10-CM | POA: Diagnosis not present

## 2024-08-25 NOTE — Assessment & Plan Note (Signed)
 Pt with obesity, HTN and hyperlipidemia Normal EKG with rate of 60   Blood pressure is well controlled No cardiac/pulmonary issues  No allergy to topical meds or pain meds in past  Has never had surgery  In very good physical shape/ physical job   No restrictions for surgery  The orthopedic practice will order pre operative labs

## 2024-08-25 NOTE — Assessment & Plan Note (Signed)
 Discussed how this problem influences overall health and the risks it imposes  Reviewed plan for weight loss with lower calorie diet (via better food choices (lower glycemic and portion control) along with exercise building up to or more than 30 minutes 5 days per week including some aerobic activity and strength training   Commended 5 lb weight loss recent

## 2024-08-25 NOTE — Patient Instructions (Signed)
 No restrictions for surgery  Blood pressure is in good control   Take care of yourself

## 2024-08-25 NOTE — Assessment & Plan Note (Signed)
 bp in fair control at this time  BP Readings from Last 1 Encounters:  08/25/24 118/71   No changes needed Most recent labs reviewed  Disc lifstyle change with low sodium diet and exercise  Last labs reviewed , also ortho will do labs before his shoulder surgery Continue amlodipine  5 mg daily and losartan  50 mg daily

## 2024-08-25 NOTE — Progress Notes (Unsigned)
 Subjective:    Patient ID: Raymond West, male    DOB: 1975-12-29, 48 y.o.   MRN: [id number]  HPI  Wt Readings from Last 3 Encounters:  08/25/24 269 lb 2 oz (122.1 kg)  05/29/24 274 lb (124.3 kg)  01/24/24 274 lb (124.3 kg)   38.62 kg/m  Vitals:   08/25/24 0757 08/25/24 0829  BP: 136/72 118/71  Pulse: (!) 58   Temp: 97.9 F (36.6 C)   SpO2: 97%     Pt presents for  Surgical clearance visit  Orthopedic surgery - left total shoulder arthropathy L  Will have general and local anes  No problems with numbing medicines in past      HTN bp is stable today  No cp or palpitations or headaches or edema  No side effects to medicines  BP Readings from Last 3 Encounters:  08/25/24 118/71  05/29/24 138/84  02/25/24 128/87     Lab Results  Component Value Date   NA 139 10/06/2023   K 4.1 10/06/2023   CO2 30 10/06/2023   GLUCOSE 100 (H) 10/06/2023   BUN 16 10/06/2023   CREATININE 0.93 10/06/2023   CALCIUM 9.2 10/06/2023   GFR 97.61 10/06/2023   GFRNONAA >60 03/15/2022   Losartan  50 mg daily  Amlodipine  5 mg daily   No blood thinners  No aspirin   No cp  No shortness of breath   Does bleed easily (he thinks)   Fatty liver Lab Results  Component Value Date   ALT 47 10/06/2023   AST 30 10/06/2023   ALKPHOS 66 10/06/2023   BILITOT 0.8 10/06/2023     Fibrosis 4 Score = 1 (Low risk)        Interpretation for patients with NAFLD          <1.30       -  F0-F1 (Low risk)          1.30-2.67 -  Indeterminate           >2.67      -  F3-F4 (High risk)     Validated for ages 75-65      Score is based on outdated labs. ALT, AST, and platelets should all be measured within the last 6 months for an accurate FIB-4 Score  Lab Results  Component Value Date   WBC 8.6 10/06/2023   HGB 15.5 10/06/2023   HCT 46.1 10/06/2023   MCV 88.7 10/06/2023   PLT 211.0 10/06/2023   Lab Results  Component Value Date   ALT 47 10/06/2023   AST 30 10/06/2023   ALKPHOS 66  10/06/2023   BILITOT 0.8 10/06/2023    In good shape Works physical job   EKG :  NSR 60  no ST,T change  normal PR and QT    Patient Active Problem List   Diagnosis Date Noted   Pre-operative cardiovascular examination 08/25/2024   Hyperlipidemia 10/10/2023   Encounter for hepatitis C screening test for low risk patient 10/06/2023   Fatty liver 03/17/2022   Lung nodules 03/17/2022   Colon cancer screening 09/22/2021   H/O: pneumonia 03/18/2018   Class 2 severe obesity due to excess calories with serious comorbidity and body mass index (BMI) of 38.0 to 38.9 in adult 06/26/2016   Athlete's foot 06/08/2014   Routine general medical examination at a health care facility 05/27/2012   Essential hypertension 01/15/2010   Past Medical History:  Diagnosis Date   Dysplastic nevus 04/02/2010   right upper back,  3.0 cm lat to spine, moderate to severe atypia    Dysplastic nevus 01/05/2024   R sacral area, moderate   Neoplasm of uncertain behavior of skin    Unspecified essential hypertension    Past Surgical History:  Procedure Laterality Date   NO PAST SURGERIES     Social History   Tobacco Use   Smoking status: Never   Smokeless tobacco: Never  Vaping Use   Vaping status: Never Used  Substance Use Topics   Alcohol use: Yes    Alcohol/week: 0.0 standard drinks of alcohol    Comment: rarely   Drug use: No   Family History  Problem Relation Age of Onset   Leukemia Mother    Diabetes Mother    Kidney disease Father        has been on dialysis for years   Diabetes Father    Hypertension Father    Neuropathy Father    Gout Father    Arthritis Maternal Grandmother    Arthritis Maternal Grandfather    Arthritis Paternal Grandmother    Arthritis Paternal Grandfather    Heart attack Paternal Grandfather        smoker   Colon cancer Neg Hx    Esophageal cancer Neg Hx    Allergies  Allergen Reactions   Amoxicillin     REACTION: rash   Benazepril Cough   Keflex   [Cephalexin ]     Rash    Lamisil  [Terbinafine ]    Penicillins Rash   Current Outpatient Medications on File Prior to Visit  Medication Sig Dispense Refill   amLODipine  (NORVASC ) 5 MG tablet Take 1 tablet (5 mg total) by mouth daily. 90 tablet 3   losartan  (COZAAR ) 100 MG tablet Take 0.5 tablets (50 mg total) by mouth daily. 45 tablet 3   No current facility-administered medications on file prior to visit.    Review of Systems  Constitutional:  Negative for activity change, appetite change, fatigue, fever and unexpected weight change.  HENT:  Negative for congestion, rhinorrhea, sore throat and trouble swallowing.   Eyes:  Negative for pain, redness, itching and visual disturbance.  Respiratory:  Negative for cough, chest tightness, shortness of breath and wheezing.   Cardiovascular:  Negative for chest pain and palpitations.  Gastrointestinal:  Negative for abdominal pain, blood in stool, constipation, diarrhea and nausea.  Endocrine: Negative for cold intolerance, heat intolerance, polydipsia and polyuria.  Genitourinary:  Negative for difficulty urinating, dysuria, frequency and urgency.  Musculoskeletal:  Negative for arthralgias, joint swelling and myalgias.  Skin:  Negative for pallor and rash.  Neurological:  Negative for dizziness, tremors, weakness, numbness and headaches.  Hematological:  Negative for adenopathy. Does not bruise/bleed easily.  Psychiatric/Behavioral:  Negative for decreased concentration and dysphoric mood. The patient is not nervous/anxious.        Objective:   Physical Exam Constitutional:      General: He is not in acute distress.    Appearance: Normal appearance. He is well-developed. He is obese. He is not ill-appearing or diaphoretic.  HENT:     Head: Normocephalic and atraumatic.  Eyes:     Conjunctiva/sclera: Conjunctivae normal.     Pupils: Pupils are equal, round, and reactive to light.  Neck:     Thyroid : No thyromegaly.     Vascular: No  carotid bruit or JVD.  Cardiovascular:     Rate and Rhythm: Normal rate and regular rhythm.     Heart sounds: Normal heart sounds.  No gallop.  Pulmonary:     Effort: Pulmonary effort is normal. No respiratory distress.     Breath sounds: Normal breath sounds. No wheezing or rales.  Abdominal:     General: There is no distension or abdominal bruit.     Palpations: Abdomen is soft. There is no mass.     Tenderness: There is no abdominal tenderness.  Musculoskeletal:     Cervical back: Normal range of motion and neck supple.     Right lower leg: No edema.     Left lower leg: No edema.  Lymphadenopathy:     Cervical: No cervical adenopathy.  Skin:    General: Skin is warm and dry.     Coloration: Skin is not pale.     Findings: No rash.     Comments: Ruddy complexion  No bruising   Neurological:     Mental Status: He is alert.     Coordination: Coordination normal.     Deep Tendon Reflexes: Reflexes are normal and symmetric. Reflexes normal.  Psychiatric:        Mood and Affect: Mood normal.           Assessment & Plan:   Problem List Items Addressed This Visit       Cardiovascular and Mediastinum   Essential hypertension   bp in fair control at this time  BP Readings from Last 1 Encounters:  08/25/24 118/71   No changes needed Most recent labs reviewed  Disc lifstyle change with low sodium diet and exercise  Last labs reviewed , also ortho will do labs before his shoulder surgery Continue amlodipine  5 mg daily and losartan  50 mg daily         Digestive   Fatty liver   Lab Results  Component Value Date   ALT 47 10/06/2023   AST 30 10/06/2023   ALKPHOS 66 10/06/2023   BILITOT 0.8 10/06/2023    Encouraged weight loss/ healthy diet  Weight is down 5 lb         Other   Pre-operative cardiovascular examination - Primary   Pt with obesity, HTN and hyperlipidemia Normal EKG with rate of 60   Blood pressure is well controlled No cardiac/pulmonary  issues  No allergy to topical meds or pain meds in past  Has never had surgery  In very good physical shape/ physical job   No restrictions for surgery  The orthopedic practice will order pre operative labs        Relevant Orders   EKG 12-Lead   Class 2 severe obesity due to excess calories with serious comorbidity and body mass index (BMI) of 38.0 to 38.9 in adult   Discussed how this problem influences overall health and the risks it imposes  Reviewed plan for weight loss with lower calorie diet (via better food choices (lower glycemic and portion control) along with exercise building up to or more than 30 minutes 5 days per week including some aerobic activity and strength training   Commended 5 lb weight loss recent

## 2024-08-25 NOTE — Assessment & Plan Note (Addendum)
 Lab Results  Component Value Date   ALT 47 10/06/2023   AST 30 10/06/2023   ALKPHOS 66 10/06/2023   BILITOT 0.8 10/06/2023    Encouraged weight loss/ healthy diet  Weight is down 5 lb

## 2024-10-09 ENCOUNTER — Other Ambulatory Visit: Payer: Self-pay | Admitting: Family Medicine

## 2024-10-09 ENCOUNTER — Encounter: Payer: Self-pay | Admitting: Family Medicine

## 2024-10-09 ENCOUNTER — Ambulatory Visit: Payer: BC Managed Care – PPO | Admitting: Family Medicine

## 2024-10-09 ENCOUNTER — Ambulatory Visit: Payer: Self-pay | Admitting: Family Medicine

## 2024-10-09 VITALS — BP 120/70 | HR 65 | Temp 97.4°F | Ht 71.75 in | Wt 275.8 lb

## 2024-10-09 DIAGNOSIS — K760 Fatty (change of) liver, not elsewhere classified: Secondary | ICD-10-CM

## 2024-10-09 DIAGNOSIS — I10 Essential (primary) hypertension: Secondary | ICD-10-CM

## 2024-10-09 DIAGNOSIS — Z0000 Encounter for general adult medical examination without abnormal findings: Secondary | ICD-10-CM

## 2024-10-09 DIAGNOSIS — E66812 Obesity, class 2: Secondary | ICD-10-CM

## 2024-10-09 DIAGNOSIS — E7800 Pure hypercholesterolemia, unspecified: Secondary | ICD-10-CM

## 2024-10-09 DIAGNOSIS — Z6837 Body mass index (BMI) 37.0-37.9, adult: Secondary | ICD-10-CM

## 2024-10-09 DIAGNOSIS — Z1211 Encounter for screening for malignant neoplasm of colon: Secondary | ICD-10-CM

## 2024-10-09 DIAGNOSIS — M19012 Primary osteoarthritis, left shoulder: Secondary | ICD-10-CM | POA: Diagnosis not present

## 2024-10-09 LAB — CBC WITH DIFFERENTIAL/PLATELET
Basophils Absolute: 0 K/uL (ref 0.0–0.1)
Basophils Relative: 0.5 % (ref 0.0–3.0)
Eosinophils Absolute: 0.2 K/uL (ref 0.0–0.7)
Eosinophils Relative: 2.8 % (ref 0.0–5.0)
HCT: 43.4 % (ref 39.0–52.0)
Hemoglobin: 14.8 g/dL (ref 13.0–17.0)
Lymphocytes Relative: 32.9 % (ref 12.0–46.0)
Lymphs Abs: 2.5 K/uL (ref 0.7–4.0)
MCHC: 34.1 g/dL (ref 30.0–36.0)
MCV: 87 fl (ref 78.0–100.0)
Monocytes Absolute: 0.5 K/uL (ref 0.1–1.0)
Monocytes Relative: 6.3 % (ref 3.0–12.0)
Neutro Abs: 4.4 K/uL (ref 1.4–7.7)
Neutrophils Relative %: 57.5 % (ref 43.0–77.0)
Platelets: 196 K/uL (ref 150.0–400.0)
RBC: 4.99 Mil/uL (ref 4.22–5.81)
RDW: 13.5 % (ref 11.5–15.5)
WBC: 7.7 K/uL (ref 4.0–10.5)

## 2024-10-09 LAB — LIPID PANEL
Cholesterol: 158 mg/dL (ref 0–200)
HDL: 34.2 mg/dL — ABNORMAL LOW (ref >39.00)
LDL Cholesterol: 103 mg/dL — ABNORMAL HIGH (ref 0–99)
NonHDL: 124.26
Total CHOL/HDL Ratio: 5
Triglycerides: 108 mg/dL (ref 0.0–149.0)
VLDL: 21.6 mg/dL (ref 0.0–40.0)

## 2024-10-09 LAB — COMPREHENSIVE METABOLIC PANEL WITH GFR
ALT: 36 U/L (ref 0–53)
AST: 26 U/L (ref 0–37)
Albumin: 4.2 g/dL (ref 3.5–5.2)
Alkaline Phosphatase: 54 U/L (ref 39–117)
BUN: 17 mg/dL (ref 6–23)
CO2: 28 meq/L (ref 19–32)
Calcium: 8.8 mg/dL (ref 8.4–10.5)
Chloride: 105 meq/L (ref 96–112)
Creatinine, Ser: 0.8 mg/dL (ref 0.40–1.50)
GFR: 104.46 mL/min (ref >60.00)
Glucose, Bld: 98 mg/dL (ref 70–99)
Potassium: 4.2 meq/L (ref 3.5–5.1)
Sodium: 140 meq/L (ref 135–145)
Total Bilirubin: 0.7 mg/dL (ref 0.2–1.2)
Total Protein: 6.4 g/dL (ref 6.0–8.3)

## 2024-10-09 LAB — TSH: TSH: 2.75 u[IU]/mL (ref 0.35–5.50)

## 2024-10-09 NOTE — Patient Instructions (Addendum)
 Good luck with your surgery   Start working on healthy diet for exercise (and exercise when you are released to)   Avoid added sugars in your diet when you can  Try to get most of your carbohydrates from produce (with the exception of white potatoes) and whole grains Eat less bread/pasta/rice/snack foods/cereals/sweets and other items from the middle of the grocery store (processed carbs)  Lab today   Take care of yourself

## 2024-10-09 NOTE — Assessment & Plan Note (Signed)
 Last LFTs were normal Lab today   Plans to work on weight loss this winter in context of shoulder replacement   Will be able to eat at home/ fix food himself

## 2024-10-09 NOTE — Assessment & Plan Note (Signed)
 Discussed how this problem influences overall health and the risks it imposes  Reviewed plan for weight loss with lower calorie diet (via better food choices (lower glycemic and portion control) along with exercise building up to or more than 30 minutes 5 days per week including some aerobic activity and strength training    Will be able to exercise after recovering from shoulder replacement surgery

## 2024-10-09 NOTE — Progress Notes (Signed)
 Subjective:    Patient ID: Raymond West, male    DOB: 07-08-1976, 48 y.o.   MRN: [id number]  HPI  Here for health maintenance exam and to review chronic medical problems   Wt Readings from Last 3 Encounters:  10/09/24 275 lb 12.8 oz (125.1 kg)  08/25/24 269 lb 2 oz (122.1 kg)  05/29/24 274 lb (124.3 kg)   37.67 kg/m  Vitals:   10/09/24 0827  BP: 120/70  Pulse: 65  Temp: (!) 97.4 F (36.3 C)  SpO2: 97%    Immunization History  Administered Date(s) Administered   Influenza, Seasonal, Injecte, Preservative Fre 10/06/2023, 08/25/2024   Influenza,inj,Quad PF,6+ Mos 12/01/2013, 08/27/2017, 08/04/2018, 09/19/2019, 09/20/2020, 09/22/2021, 09/23/2022   PFIZER(Purple Top)SARS-COV-2 Vaccination 02/23/2020, 03/15/2020, 10/25/2020   Pneumococcal Polysaccharide-23 08/26/2018   Td 08/04/2010   Tdap 11/02/2019    Health Maintenance Due  Topic Date Due   Hepatitis B Vaccines 19-59 Average Risk (1 of 3 - 19+ 3-dose series) Never done   COVID-19 Vaccine (4 - 2025-26 season) 07/10/2024   Had flu shot shot in oct   Has to have a shoulder replacement  Planned for the 18th    Prostate health No problems  No trouble urinating    Colon cancer screening  Colonoscopy 02/2022 with 10 y recall   Bone health  Falls-none Fractures-none  Supplements - mvi daily   Exercise  Not much  Work - heat and air  Hurts to raise his arm with shoulder issue   Will be starting to walk  Plans to work on weight loss when at home after surgery   Eats pretty healthy  Some fruits/veggies  Lots of protein / meat and eggs   Utd derm care    Mood    08/25/2024    8:21 AM 01/24/2024   11:31 AM 10/06/2023    8:32 AM 09/23/2022    9:36 AM 09/22/2021    4:13 PM  Depression screen PHQ 2/9  Decreased Interest 0 0 0 0 0  Down, Depressed, Hopeless 0 0 0 0 0  PHQ - 2 Score 0 0 0 0 0  Altered sleeping 0 0 0  0  Tired, decreased energy 0 0 0  0  Change in appetite 0 0 0  0  Feeling bad or  failure about yourself  0 0 0  0  Trouble concentrating 0 0 0  0  Moving slowly or fidgety/restless 0 0 0  0  Suicidal thoughts 0 0 0  0  PHQ-9 Score 0  0  0   0   Difficult doing work/chores Not difficult at all Not difficult at all Not difficult at all  Not difficult at all     Data saved with a previous flowsheet row definition   HTN bp is stable today  No cp or palpitations or headaches or edema  No side effects to medicines  BP Readings from Last 3 Encounters:  10/09/24 120/70  08/25/24 118/71  05/29/24 138/84     Lab Results  Component Value Date   NA 139 10/06/2023   K 4.1 10/06/2023   CO2 30 10/06/2023   GLUCOSE 100 (H) 10/06/2023   BUN 16 10/06/2023   CREATININE 0.93 10/06/2023   CALCIUM 9.2 10/06/2023   GFR 97.61 10/06/2023   GFRNONAA >60 03/15/2022   Amlodipine  5 mg daily  Losartan  50 mg daily  History of fatty liver Due for labs /last LFTs normal  Hyperlipidemia Diet controlled Due for labs Lab Results  Component Value Date   CHOL 187 10/06/2023   HDL 38.40 (L) 10/06/2023   LDLCALC 128 (H) 10/06/2023   LDLDIRECT 118.5 05/29/2013   TRIG 104.0 10/06/2023   CHOLHDL 5 10/06/2023     Patient Active Problem List   Diagnosis Date Noted   Pre-operative cardiovascular examination 08/25/2024   Hyperlipidemia 10/10/2023   Fatty liver 03/17/2022   Lung nodules 03/17/2022   Colon cancer screening 09/22/2021   H/O: pneumonia 03/18/2018   Class 2 obesity due to excess calories in adult 06/26/2016   Athlete's foot 06/08/2014   Routine general medical examination at a health care facility 05/27/2012   Essential hypertension 01/15/2010   Past Medical History:  Diagnosis Date   Dysplastic nevus 04/02/2010   right upper back, 3.0 cm lat to spine, moderate to severe atypia    Dysplastic nevus 01/05/2024   R sacral area, moderate   Neoplasm of uncertain behavior of skin    Unspecified essential hypertension    Past Surgical History:  Procedure  Laterality Date   NO PAST SURGERIES     Social History   Tobacco Use   Smoking status: Never   Smokeless tobacco: Never  Vaping Use   Vaping status: Never Used  Substance Use Topics   Alcohol use: Yes    Alcohol/week: 0.0 standard drinks of alcohol    Comment: rarely   Drug use: No   Family History  Problem Relation Age of Onset   Leukemia Mother    Diabetes Mother    Kidney disease Father        has been on dialysis for years   Diabetes Father    Hypertension Father    Neuropathy Father    Gout Father    Arthritis Maternal Grandmother    Arthritis Maternal Grandfather    Arthritis Paternal Grandmother    Arthritis Paternal Grandfather    Heart attack Paternal Grandfather        smoker   Colon cancer Neg Hx    Esophageal cancer Neg Hx    Allergies  Allergen Reactions   Amoxicillin     REACTION: rash   Benazepril Cough   Keflex  [Cephalexin ]     Rash    Lamisil  [Terbinafine ]    Penicillins Rash   Current Outpatient Medications on File Prior to Visit  Medication Sig Dispense Refill   amLODipine  (NORVASC ) 5 MG tablet Take 1 tablet (5 mg total) by mouth daily. 90 tablet 3   losartan  (COZAAR ) 100 MG tablet Take 0.5 tablets (50 mg total) by mouth daily. 45 tablet 3   Multiple Vitamin (MULTIVITAMIN) tablet Take 1 tablet by mouth daily.     No current facility-administered medications on file prior to visit.    Review of Systems  Constitutional:  Negative for activity change, appetite change, fatigue, fever and unexpected weight change.  HENT:  Negative for congestion, rhinorrhea, sore throat and trouble swallowing.   Eyes:  Negative for pain, redness, itching and visual disturbance.  Respiratory:  Negative for cough, chest tightness, shortness of breath and wheezing.   Cardiovascular:  Negative for chest pain and palpitations.  Gastrointestinal:  Negative for abdominal pain, blood in stool, constipation, diarrhea and nausea.  Endocrine: Negative for cold  intolerance, heat intolerance, polydipsia and polyuria.  Genitourinary:  Negative for difficulty urinating, dysuria, frequency and urgency.  Musculoskeletal:  Positive for arthralgias. Negative for joint swelling and myalgias.       Shoulder pain   Skin:  Negative for pallor and rash.  Neurological:  Negative for dizziness, tremors, weakness, numbness and headaches.  Hematological:  Negative for adenopathy. Does not bruise/bleed easily.  Psychiatric/Behavioral:  Negative for decreased concentration and dysphoric mood. The patient is not nervous/anxious.        Objective:   Physical Exam Constitutional:      General: He is not in acute distress.    Appearance: Normal appearance. He is well-developed. He is obese. He is not ill-appearing or diaphoretic.  HENT:     Head: Normocephalic and atraumatic.     Right Ear: Tympanic membrane, ear canal and external ear normal.     Left Ear: Tympanic membrane, ear canal and external ear normal.     Nose: Nose normal. No congestion.     Mouth/Throat:     Mouth: Mucous membranes are moist.     Pharynx: Oropharynx is clear. No posterior oropharyngeal erythema.  Eyes:     General: No scleral icterus.       Right eye: No discharge.        Left eye: No discharge.     Conjunctiva/sclera: Conjunctivae normal.     Pupils: Pupils are equal, round, and reactive to light.  Neck:     Thyroid : No thyromegaly.     Vascular: No carotid bruit or JVD.  Cardiovascular:     Rate and Rhythm: Normal rate and regular rhythm.     Pulses: Normal pulses.     Heart sounds: Normal heart sounds.     No gallop.  Pulmonary:     Effort: Pulmonary effort is normal. No respiratory distress.     Breath sounds: Normal breath sounds. No wheezing or rales.     Comments: Good air exch Chest:     Chest wall: No tenderness.  Abdominal:     General: Bowel sounds are normal. There is no distension or abdominal bruit.     Palpations: Abdomen is soft. There is no mass.      Tenderness: There is no abdominal tenderness.     Hernia: No hernia is present.  Musculoskeletal:        General: No tenderness.     Cervical back: Normal range of motion and neck supple. No rigidity. No muscular tenderness.     Right lower leg: No edema.     Left lower leg: No edema.  Lymphadenopathy:     Cervical: No cervical adenopathy.  Skin:    General: Skin is warm and dry.     Coloration: Skin is not pale.     Findings: No erythema or rash.     Comments: Solar lentigines diffusely   Neurological:     Mental Status: He is alert.     Cranial Nerves: No cranial nerve deficit.     Motor: No abnormal muscle tone.     Coordination: Coordination normal.     Gait: Gait normal.     Deep Tendon Reflexes: Reflexes are normal and symmetric. Reflexes normal.  Psychiatric:        Mood and Affect: Mood normal.        Cognition and Memory: Cognition and memory normal.           Assessment & Plan:   Problem List Items Addressed This Visit       Cardiovascular and Mediastinum   Essential hypertension   bp in fair control at this time  BP Readings from Last 1 Encounters:  10/09/24 120/70   No changes needed Most recent labs reviewed  Disc lifstyle change with  low sodium diet and exercise   Continue amlodipine  5 mg daily and losartan  50 mg daily       Relevant Orders   TSH   Lipid Panel   Comprehensive metabolic panel   CBC with Differential/Platelet     Digestive   Fatty liver   Last LFTs were normal Lab today   Plans to work on weight loss this winter in context of shoulder replacement   Will be able to eat at home/ fix food himself       Relevant Orders   Comprehensive metabolic panel     Other   Routine general medical examination at a health care facility - Primary   Reviewed health habits including diet and exercise and skin cancer prevention Reviewed appropriate screening tests for age  Also reviewed health mt list, fam hx and immunization status ,  as well as social and family history   See HPI Labs reviewed and ordered Health Maintenance  Topic Date Due   Hepatitis B Vaccine (1 of 3 - 19+ 3-dose series) Never done   COVID-19 Vaccine (4 - 2025-26 season) 07/10/2024   DTaP/Tdap/Td vaccine (3 - Td or Tdap) 11/01/2029   Colon Cancer Screening  03/02/2032   Flu Shot  Completed   Hepatitis C Screening  Completed   HIV Screening  Completed   Pneumococcal Vaccine  Aged Out   HPV Vaccine  Aged Out   Meningitis B Vaccine  Aged Out    Utd imms Discussed fall prevention, supplements and exercise for bone density  Discussed weith loss effort  No prostate concerns Utd derm care (history of skin ca)  PHQ 0        Hyperlipidemia   Disc goals for lipids and reasons to control them Rev last labs with pt Rev low sat fat diet in detail  Diet is fair Lab today      Colon cancer screening   Colonoscopy 02/2022 with 10 y recall      Class 2 obesity due to excess calories in adult   Discussed how this problem influences overall health and the risks it imposes  Reviewed plan for weight loss with lower calorie diet (via better food choices (lower glycemic and portion control) along with exercise building up to or more than 30 minutes 5 days per week including some aerobic activity and strength training    Will be able to exercise after recovering from shoulder replacement surgery

## 2024-10-09 NOTE — Assessment & Plan Note (Signed)
 Reviewed health habits including diet and exercise and skin cancer prevention Reviewed appropriate screening tests for age  Also reviewed health mt list, fam hx and immunization status , as well as social and family history   See HPI Labs reviewed and ordered Health Maintenance  Topic Date Due   Hepatitis B Vaccine (1 of 3 - 19+ 3-dose series) Never done   COVID-19 Vaccine (4 - 2025-26 season) 07/10/2024   DTaP/Tdap/Td vaccine (3 - Td or Tdap) 11/01/2029   Colon Cancer Screening  03/02/2032   Flu Shot  Completed   Hepatitis C Screening  Completed   HIV Screening  Completed   Pneumococcal Vaccine  Aged Out   HPV Vaccine  Aged Out   Meningitis B Vaccine  Aged Out    Utd imms Discussed fall prevention, supplements and exercise for bone density  Discussed weith loss effort  No prostate concerns Utd derm care (history of skin ca)  PHQ 0

## 2024-10-09 NOTE — Assessment & Plan Note (Signed)
Disc goals for lipids and reasons to control them Rev last labs with pt Rev low sat fat diet in detail Diet is fair  Lab today

## 2024-10-09 NOTE — Assessment & Plan Note (Signed)
 bp in fair control at this time  BP Readings from Last 1 Encounters:  10/09/24 120/70   No changes needed Most recent labs reviewed  Disc lifstyle change with low sodium diet and exercise   Continue amlodipine  5 mg daily and losartan  50 mg daily

## 2024-10-09 NOTE — Assessment & Plan Note (Signed)
 Colonoscopy 02/2022 with 10 y recall

## 2024-10-10 NOTE — Telephone Encounter (Signed)
 Patient called to follow up on med refill.

## 2024-10-26 DIAGNOSIS — M19012 Primary osteoarthritis, left shoulder: Secondary | ICD-10-CM | POA: Diagnosis not present

## 2025-01-04 ENCOUNTER — Ambulatory Visit: Payer: BC Managed Care – PPO | Admitting: Dermatology
